# Patient Record
Sex: Female | Born: 1965 | Race: White | Hispanic: No | Marital: Single | Smoking: Former smoker
Health system: Southern US, Community
[De-identification: ages and names within clinical notes are randomized; demographics above are authoritative.]

## PROBLEM LIST (undated history)

## (undated) DIAGNOSIS — C801 Malignant (primary) neoplasm, unspecified: Secondary | ICD-10-CM

## (undated) DIAGNOSIS — K219 Gastro-esophageal reflux disease without esophagitis: Secondary | ICD-10-CM

---

## 2005-05-18 ENCOUNTER — Encounter: Payer: Self-pay | Admitting: Family Medicine

## 2005-05-29 ENCOUNTER — Encounter: Payer: Self-pay | Admitting: Family Medicine

## 2005-08-14 ENCOUNTER — Encounter: Payer: Self-pay | Admitting: Family Medicine

## 2005-10-04 ENCOUNTER — Ambulatory Visit: Payer: Self-pay | Admitting: Internal Medicine

## 2005-10-12 ENCOUNTER — Ambulatory Visit: Payer: Self-pay | Admitting: Internal Medicine

## 2007-02-28 ENCOUNTER — Ambulatory Visit: Payer: Self-pay | Admitting: Internal Medicine

## 2007-02-28 ENCOUNTER — Encounter: Payer: Self-pay | Admitting: Family Medicine

## 2007-03-19 ENCOUNTER — Encounter: Payer: Self-pay | Admitting: Family Medicine

## 2008-11-05 ENCOUNTER — Ambulatory Visit: Payer: Self-pay | Admitting: Family Medicine

## 2008-11-05 DIAGNOSIS — L408 Other psoriasis: Secondary | ICD-10-CM

## 2011-11-30 ENCOUNTER — Ambulatory Visit: Payer: Self-pay

## 2012-01-08 ENCOUNTER — Emergency Department: Payer: Self-pay | Admitting: Unknown Physician Specialty

## 2012-01-08 LAB — URINALYSIS, COMPLETE
Bilirubin,UR: NEGATIVE
Blood: NEGATIVE
Glucose,UR: NEGATIVE mg/dL (ref 0–75)
Nitrite: NEGATIVE
RBC,UR: 1 /HPF (ref 0–5)
Squamous Epithelial: 11

## 2015-09-16 ENCOUNTER — Encounter: Payer: Self-pay | Admitting: *Deleted

## 2015-09-16 ENCOUNTER — Ambulatory Visit
Admission: EM | Admit: 2015-09-16 | Discharge: 2015-09-16 | Disposition: A | Discharge status: Home / Self Care | Payer: Managed Care, Other (non HMO) | Attending: Family Medicine | Admitting: Family Medicine

## 2015-09-16 DIAGNOSIS — R1013 Epigastric pain: Secondary | ICD-10-CM

## 2015-09-16 LAB — URINALYSIS COMPLETE WITH MICROSCOPIC (ARMC ONLY)
BILIRUBIN URINE: NEGATIVE
Bacteria, UA: NONE SEEN
Glucose, UA: NEGATIVE mg/dL
KETONES UR: NEGATIVE mg/dL
LEUKOCYTES UA: NEGATIVE
Nitrite: NEGATIVE
PH: 6.5 (ref 5.0–8.0)
Protein, ur: NEGATIVE mg/dL
SPECIFIC GRAVITY, URINE: 1.02 (ref 1.005–1.030)

## 2015-09-16 LAB — COMPREHENSIVE METABOLIC PANEL
ALBUMIN: 4.5 g/dL (ref 3.5–5.0)
ALT: 28 U/L (ref 14–54)
ANION GAP: 7 (ref 5–15)
AST: 24 U/L (ref 15–41)
Alkaline Phosphatase: 55 U/L (ref 38–126)
BUN: 12 mg/dL (ref 6–20)
CHLORIDE: 107 mmol/L (ref 101–111)
CO2: 25 mmol/L (ref 22–32)
CREATININE: 0.57 mg/dL (ref 0.44–1.00)
Calcium: 9.2 mg/dL (ref 8.9–10.3)
Glucose, Bld: 100 mg/dL — ABNORMAL HIGH (ref 65–99)
POTASSIUM: 4 mmol/L (ref 3.5–5.1)
SODIUM: 139 mmol/L (ref 135–145)
Total Bilirubin: 0.8 mg/dL (ref 0.3–1.2)
Total Protein: 7.5 g/dL (ref 6.5–8.1)

## 2015-09-16 LAB — CBC WITH DIFFERENTIAL/PLATELET
BASOS ABS: 0.1 10*3/uL (ref 0–0.1)
BASOS PCT: 1 %
EOS ABS: 0.2 10*3/uL (ref 0–0.7)
EOS PCT: 3 %
HCT: 41.5 % (ref 35.0–47.0)
HEMOGLOBIN: 14.2 g/dL (ref 12.0–16.0)
LYMPHS ABS: 2 10*3/uL (ref 1.0–3.6)
Lymphocytes Relative: 27 %
MCH: 31.8 pg (ref 26.0–34.0)
MCHC: 34.2 g/dL (ref 32.0–36.0)
MCV: 93.2 fL (ref 80.0–100.0)
Monocytes Absolute: 0.6 10*3/uL (ref 0.2–0.9)
Monocytes Relative: 8 %
NEUTROS PCT: 61 %
Neutro Abs: 4.5 10*3/uL (ref 1.4–6.5)
PLATELETS: 191 10*3/uL (ref 150–440)
RBC: 4.45 MIL/uL (ref 3.80–5.20)
RDW: 12.5 % (ref 11.5–14.5)
WBC: 7.4 10*3/uL (ref 3.6–11.0)

## 2015-09-16 LAB — LIPASE, BLOOD: LIPASE: 26 U/L (ref 11–51)

## 2015-09-16 LAB — AMYLASE: AMYLASE: 84 U/L (ref 28–100)

## 2015-09-16 MED ORDER — SUCRALFATE 1 G PO TABS: 2.0000 g | ORAL_TABLET | Freq: Two times a day (BID) | ORAL | Status: DC

## 2015-09-16 MED ORDER — SUCRALFATE 1 G PO TABS
2.0000 g | ORAL_TABLET | Freq: Two times a day (BID) | ORAL | Status: DC
Start: 2015-09-16 — End: 2015-10-18

## 2015-09-16 NOTE — ED Notes (Signed)
C/o intermittent, intense, epigastric pain for past year. In past week pain has become more continuous and more common at night. Pt states intermittent diarrhea but denies N/V, hematemesis, and black stools.

## 2015-09-16 NOTE — ED Provider Notes (Signed)
CSN: [id number]     Arrival date & time 09/16/15  [id number] History   First MD Initiated Contact with Patient 09/16/15 1121    Nurses notes were reviewed. Chief Complaint  Patient presents with  . Abdominal Pain   Patient is a 50 year old white female with history abdominal pain for over a year. She states she does not have a PCP anymore since she has not seen one in over 3 years. This abdominal pain comes and goes use is somewhat sharp but then it does pass. For the last 3-4 days she's had a upper quadrant abdominal pain that has not gotten better does been persistent and causing a significant mild discomfort. She denies any nausea vomiting or diarrhea. She cannot really time this with eating. However she states that she was unable to sleep last night because of the abdominal pain just got that bad. She did not eat anything today but she has a funeral to go to such did not want ultrasound done today if it was possible. She is requesting a GI consultation but explained this difficult when not sure was going on. She denies any abdominal surgeries. Her father does have or had gallstones. She denies any dyspareunia or vaginal discharge or pelvic pain.       (Consider location/radiation/quality/duration/timing/severity/associated sxs/prior Treatment) Patient is a 50 y.o. female presenting with abdominal pain. The history is provided by the patient. No language interpreter was used.  Abdominal Pain Pain location:  Epigastric Pain quality: aching, cramping and gnawing   Pain quality: not bloating, not burning and no fullness   Pain radiates to:  Does not radiate Pain severity:  Moderate Duration:  3 days Timing:  Constant Chronicity:  Recurrent Context: not alcohol use, not diet changes, not eating, not medication withdrawal, not previous surgeries, not recent illness, not retching, not sick contacts, not suspicious food intake and not trauma   Relieved by:  Nothing Worsened by:  Nothing  tried Ineffective treatments:  None tried Associated symptoms: no chest pain and no shortness of breath   Risk factors: not pregnant     History reviewed. No pertinent past medical history. History reviewed. No pertinent past surgical history. History reviewed. No pertinent family history. Social History  Substance Use Topics  . Smoking status: Former Research scientist (life sciences)  . Smokeless tobacco: None  . Alcohol Use: Yes   OB History    No data available     Review of Systems  Respiratory: Negative for shortness of breath.   Cardiovascular: Negative for chest pain.  Gastrointestinal: Positive for abdominal pain.  All other systems reviewed and are negative.   Allergies  Shellfish allergy  Home Medications   Prior to Admission medications   Medication Sig Start Date End Date Taking? Authorizing Provider  sucralfate (CARAFATE) 1 g tablet Take 2 tablets (2 g total) by mouth 2 (two) times daily. An hour before eating. 09/16/15   Frederich Cha, MD   Meds Ordered and Administered this Visit  Medications - No data to display  BP 131/76 mmHg  Pulse 60  Temp(Src) 98.4 F (36.9 C) (Oral)  Resp 16  Ht 5\' 4"  (1.626 m)  Wt 160 lb (72.576 kg)  BMI 27.45 kg/m2  SpO2 99% No data found.   Physical Exam  Constitutional: She is oriented to person, place, and time. She appears well-developed and well-nourished.  HENT:  Head: Normocephalic and atraumatic.  Eyes: Pupils are equal, round, and reactive to light.  Neck: Normal range of motion. Neck supple.  Cardiovascular: Normal rate, regular rhythm and normal heart sounds.   Pulmonary/Chest: Effort normal and breath sounds normal.  Abdominal: Soft. Bowel sounds are normal. There is no hepatosplenomegaly. There is tenderness in the right upper quadrant and epigastric area. There is no CVA tenderness. No hernia. Hernia confirmed negative in the ventral area.    Musculoskeletal: Normal range of motion. She exhibits no edema.  Lymphadenopathy:    She  has no cervical adenopathy.  Neurological: She is alert and oriented to person, place, and time.  Skin: Skin is warm and dry.  Psychiatric: She has a normal mood and affect. Her behavior is normal.  Vitals reviewed.   ED Course  Procedures (including critical care time)  Labs Review Labs Reviewed  COMPREHENSIVE METABOLIC PANEL - Abnormal; Notable for the following:    Glucose, Bld 100 (*)    All other components within normal limits  URINALYSIS COMPLETEWITH MICROSCOPIC ([facility] ONLY) - Abnormal; Notable for the following:    Hgb urine dipstick TRACE (*)    Squamous Epithelial / LPF 0-5 (*)    All other components within normal limits  URINE CULTURE  CBC WITH DIFFERENTIAL/PLATELET  LIPASE, BLOOD  AMYLASE  H PYLORI, IGM, IGG, IGA AB    Imaging Review No results found.   Visual Acuity Review  Right Eye Distance:   Left Eye Distance:   Bilateral Distance:    Right Eye Near:   Left Eye Near:    Bilateral Near:         MDM   1. Abdominal pain, epigastric    Patient with abdominal pain. Will obtain ultrasound of gallbladder she requested for tomorrow stiff today will acquiesce to her request we'll place on Carafate 2 tablets twice a day on into stomach. Lab work obtained CMP and CBC H. pylori amylase and lipase. But since patient would have to stay away for lab works will have nurses call results but the patient is a come in. Explained to patient that all the lab work is not back today either.  He requested a GI consultation but explained to her will need to get to see the lab results look like ultrasound and if gallbladder has stones she needs a surgical referral and not a GI referral. She states she does not have a primary care the wound was listed because she has not seen them in over 3 years.   Offered work note for today.    Note: This dictation was prepared with Dragon dictation along with smaller phrase technology. Any transcriptional errors that result from  this process are unintentional.    Frederich Cha, MD 09/16/15 1240

## 2015-09-16 NOTE — Discharge Instructions (Signed)

## 2015-09-16 NOTE — ED Notes (Signed)
Ultrasound scheduled for 09/17/15 at 8am at [facility].  Patient instructed nothing to eat or drink after midnight on 09/17/15.

## 2015-09-17 ENCOUNTER — Telehealth: Payer: Self-pay

## 2015-09-17 ENCOUNTER — Ambulatory Visit
Admission: RE | Admit: 2015-09-17 | Discharge: 2015-09-17 | Disposition: A | Discharge status: Home / Self Care | Payer: Managed Care, Other (non HMO) | Source: Ambulatory Visit | Attending: Family Medicine | Admitting: Family Medicine

## 2015-09-17 DIAGNOSIS — R1013 Epigastric pain: Secondary | ICD-10-CM | POA: Diagnosis not present

## 2015-09-17 DIAGNOSIS — R935 Abnormal findings on diagnostic imaging of other abdominal regions, including retroperitoneum: Secondary | ICD-10-CM | POA: Diagnosis not present

## 2015-09-17 DIAGNOSIS — R1011 Right upper quadrant pain: Secondary | ICD-10-CM | POA: Diagnosis present

## 2015-09-17 LAB — URINE CULTURE: Special Requests: NORMAL

## 2015-09-17 LAB — H PYLORI, IGM, IGG, IGA AB

## 2015-09-17 NOTE — ED Notes (Signed)
Received call report today for patient's ultrasound. Patient has 2.7cm Gallstone, gallbladder sludge and 4.65mm wall thickening, and gallbladder polyps. Spoke with Max Sane, FNP she advised that patient would need to see a General Surgeon. Spoke with patient. Patient advised of results and verbalized understanding. Patient given information to contact [place]. Patient verbalized that she will call [facility] to schedule consult.

## 2015-09-20 ENCOUNTER — Telehealth: Payer: Self-pay | Admitting: General Practice

## 2015-09-20 NOTE — Telephone Encounter (Signed)
Spoke with patient, offered to make an appointment to followup from hospital visit.  Pt was seen once here, she states over 10 years ago.  She does not want to re-establish at our office, says she told this to ER staff on 09/16/15, she wants to continue on the way she is doing now.

## 2015-09-30 ENCOUNTER — Encounter: Payer: Self-pay | Admitting: Surgery

## 2015-09-30 ENCOUNTER — Ambulatory Visit (INDEPENDENT_AMBULATORY_CARE_PROVIDER_SITE_OTHER): Payer: 59 | Admitting: Surgery

## 2015-09-30 VITALS — BP 135/82 | HR 55 | Temp 98.2°F | Ht 65.0 in | Wt 164.0 lb

## 2015-09-30 DIAGNOSIS — K811 Chronic cholecystitis: Secondary | ICD-10-CM

## 2015-09-30 NOTE — Patient Instructions (Signed)
Please call our office with any questions or concerns. Please see Blue sheet for surgery information.

## 2015-09-30 NOTE — Progress Notes (Signed)
Patient ID: Dawn Parsons, female   DOB: 12-Dec-1965, 50 y.o.   MRN: [id number]  History of Present Illness Dawn Parsons is a 50 y.o. female with recurrent intermittent epigastric abdominal pain that has been present for the last year or so. She reports that the pain usually is exacerbated after fatty meal located in the epigastric area and dull in nature. Pain is severe, nonradiating it, associated with nausea and emesis. No evidence of biliary obstruction. She recently had an attack that lasted for over 12 hours and now has been having intermittent pains every other day. She has I workup revealing a large gallstone, polyp and thickening of the wall of the gallbladder. Normal common bile duct normal LFTs. She has never had an operation before and she is able to perform more than 4 Mets of activity without any shortness of breath or chest pain  Past Medical History History reviewed. No pertinent past medical history.    History reviewed. No pertinent past surgical history.  Allergies  Allergen Reactions  . Shellfish Allergy Other (See Comments)    unknown    Current Outpatient Prescriptions  Medication Sig Dispense Refill  . sucralfate (CARAFATE) 1 g tablet Take 2 tablets (2 g total) by mouth 2 (two) times daily. An hour before eating. (Patient not taking: Reported on 09/30/2015) 120 tablet 0   No current facility-administered medications for this visit.    Family History Family History  Problem Relation Age of Onset  . Diabetes Father      Social History Social History  Substance Use Topics  . Smoking status: Former Research scientist (life sciences)  . Smokeless tobacco: None  . Alcohol Use: Yes       ROS 10 pt ROS performed and is negative  Physical Exam Blood pressure 135/82, pulse 55, temperature 98.2 F (36.8 C), temperature source Oral, height 5\' 5"  (1.651 m), weight 74.39 kg (164 lb).  CONSTITUTIONAL: NAD alert EYES: Pupils equal, round, and reactive to light, Sclera non-icteric. EARS, NOSE,  MOUTH AND THROAT: The oropharynx is clear. Oral mucosa is pink and moist. Hearing is intact to voice.  NECK: Trachea is midline, and there is no jugular venous distension. Thyroid is without palpable abnormalities. LYMPH NODES:  Lymph nodes in the neck are not enlarged. RESPIRATORY:  Lungs are clear, and breath sounds are equal bilaterally. Normal respiratory effort without pathologic use of accessory muscles. CARDIOVASCULAR: Heart is regular without murmurs, gallops, or rubs. GI: The abdomen is  soft, nontender, and nondistended. There were no palpable masses. There was no hepatosplenomegaly. There were normal bowel sounds. MUSCULOSKELETAL:  Normal muscle strength and tone in all four extremities.    SKIN: Skin turgor is normal. There are no pathologic skin lesions.  NEUROLOGIC:  Motor and sensation is grossly normal.  Cranial nerves are grossly intact. PSYCH:  Alert and oriented to person, place and time. Affect is normal.  Data Reviewed I have personally reviewed the patient's imaging and medical records.    Assessment/  Plan Chronic cholecystitis. Discussed with the patient in detail about the need for cholecystectomy. The risks, benefits, complications, treatment options, and expected outcomes were discussed with the patient. The possibilities of bleeding, recurrent infection, finding a normal gallbladder, perforation of viscus organs, damage to surrounding structures, bile leak, abscess formation, needing a drain placed, the need for additional procedures, reaction to medication, pulmonary aspiration,  failure to diagnose a condition, the possible need to convert to an open procedure, and creating a complication requiring transfusion or operation were  discussed with the patient. The patient and/or family concurred with the proposed plan, giving informed consent.  She wishes to schedule the procedure within the next 3 weeks or so. Extensive counseling provided  Caroleen Hamman, MD [place] 09/30/2015, 10:54 AM

## 2015-10-04 ENCOUNTER — Telehealth: Payer: Self-pay | Admitting: Surgery

## 2015-10-04 NOTE — Telephone Encounter (Signed)
Pt advised of pre op date/time and sx date. Sx: 10/28/15 with Dr Pabon-laparoscopic cholecystectomy.  Pre op: 10/18/15 between 9-1:00pm--phone.   Patient made aware to call [phone], between 1-3:00pm the day before surgery, to find out what time to arrive.    Patient advised of the estimated out of pocket amount for physician is 786.28. Deductible remaining is 2,937.50 with a 20% co insurance.

## 2015-10-18 ENCOUNTER — Telehealth: Payer: Self-pay

## 2015-10-18 ENCOUNTER — Encounter
Admission: RE | Admit: 2015-10-18 | Discharge: 2015-10-18 | Disposition: A | Discharge status: Home / Self Care | Payer: Managed Care, Other (non HMO) | Source: Ambulatory Visit | Attending: Surgery | Admitting: Surgery

## 2015-10-18 HISTORY — DX: Malignant (primary) neoplasm, unspecified: C80.1

## 2015-10-18 HISTORY — DX: Gastro-esophageal reflux disease without esophagitis: K21.9

## 2015-10-18 NOTE — Telephone Encounter (Signed)
Notified by Pre-admit that patient has ring in Clitoris and that surgeon can either go over risks of leaving this in during surgery, have the patient agree to take this out for surgery, or refuse to do the surgery.  Spoke with patient whom is not wanting to remove this but would like to talk this over with her boyfriend before making this decision.  Dr. Dahlia Byes was notified at this time. If patient is not agreeable to remove this for surgery, he would like to review risks of leaving this in prior to surgery. Will have patient come in to clinic later this week to discuss this matter if she is not agreeable to having this removed.

## 2015-10-18 NOTE — Patient Instructions (Signed)
  Your procedure is scheduled on: 10-28-15 Report to Same Day Surgery 2nd floor medical mall To find out your arrival time please call [phone] between 1PM - 3PM on 10-27-15  Remember: Instructions that are not followed completely may result in serious medical risk, up to and including death, or upon the discretion of your surgeon and anesthesiologist your surgery may need to be rescheduled.    _x___ 1. Do not eat food or drink liquids after midnight. No gum chewing or hard candies.     __x__ 2. No Alcohol for 24 hours before or after surgery.   __x__3. No Smoking for 24 prior to surgery.   ____  4. Bring all medications with you on the day of surgery if instructed.    __x__ 5. Notify your doctor if there is any change in your medical condition     (cold, fever, infections).     Do not wear jewelry, make-up, hairpins, clips or nail polish.  Do not wear lotions, powders, or perfumes. You may wear deodorant.  Do not shave 48 hours prior to surgery. Men may shave face and neck.  Do not bring valuables to the hospital.    [facility] is not responsible for any belongings or valuables.               Contacts, dentures or bridgework may not be worn into surgery.  Leave your suitcase in the car. After surgery it may be brought to your room.  For patients admitted to the hospital, discharge time is determined by your treatment team.   Patients discharged the day of surgery will not be allowed to drive home.    Please read over the following fact sheets that you were given:   [facility] Preparing for Surgery and or MRSA Information   ____ Take these medicines the morning of surgery with A SIP OF WATER:    1. NONE  2.  3.  4.  5.  6.  ____ Fleet Enema (as directed)   ____ Use CHG Soap or sage wipes as directed on instruction sheet   ____ Use inhalers on the day of surgery and bring to hospital day of surgery  ____ Stop metformin 2 days prior to surgery    ____ Take 1/2 of  usual insulin dose the night before surgery and none on the morning of surgery.   ____ Stop aspirin or coumadin, or plavix  _x__ Stop Anti-inflammatories such as Advil, Aleve, Ibuprofen, Motrin, Naproxen,          Naprosyn, Goodies powders or aspirin products 7 DAYS PRIOR-Ok to take Tylenol.   ____ Stop supplements until after surgery.    ____ Bring C-Pap to the hospital.

## 2015-10-28 ENCOUNTER — Ambulatory Visit
Admission: RE | Admit: 2015-10-28 | Discharge: 2015-10-28 | Disposition: A | Discharge status: Home / Self Care | Payer: Managed Care, Other (non HMO) | Source: Ambulatory Visit | Attending: Surgery | Admitting: Surgery

## 2015-10-28 ENCOUNTER — Encounter: Payer: Self-pay | Admitting: *Deleted

## 2015-10-28 ENCOUNTER — Ambulatory Visit: Payer: Managed Care, Other (non HMO) | Admitting: Anesthesiology

## 2015-10-28 ENCOUNTER — Encounter
Admission: RE | Disposition: A | Discharge status: Home / Self Care | Payer: Self-pay | Source: Ambulatory Visit | Attending: Surgery

## 2015-10-28 DIAGNOSIS — Z87891 Personal history of nicotine dependence: Secondary | ICD-10-CM | POA: Insufficient documentation

## 2015-10-28 DIAGNOSIS — Z833 Family history of diabetes mellitus: Secondary | ICD-10-CM | POA: Insufficient documentation

## 2015-10-28 DIAGNOSIS — Z79899 Other long term (current) drug therapy: Secondary | ICD-10-CM | POA: Diagnosis not present

## 2015-10-28 DIAGNOSIS — Z91013 Allergy to seafood: Secondary | ICD-10-CM | POA: Diagnosis not present

## 2015-10-28 DIAGNOSIS — K8010 Calculus of gallbladder with chronic cholecystitis without obstruction: Secondary | ICD-10-CM | POA: Insufficient documentation

## 2015-10-28 DIAGNOSIS — K828 Other specified diseases of gallbladder: Secondary | ICD-10-CM | POA: Diagnosis not present

## 2015-10-28 DIAGNOSIS — E669 Obesity, unspecified: Secondary | ICD-10-CM | POA: Insufficient documentation

## 2015-10-28 DIAGNOSIS — K219 Gastro-esophageal reflux disease without esophagitis: Secondary | ICD-10-CM | POA: Diagnosis not present

## 2015-10-28 DIAGNOSIS — Z6827 Body mass index (BMI) 27.0-27.9, adult: Secondary | ICD-10-CM | POA: Insufficient documentation

## 2015-10-28 HISTORY — PX: CHOLECYSTECTOMY: SHX55

## 2015-10-28 SURGERY — LAPAROSCOPIC CHOLECYSTECTOMY
Anesthesia: General | Wound class: Clean Contaminated

## 2015-10-28 MED ORDER — SUCCINYLCHOLINE CHLORIDE 20 MG/ML IJ SOLN
INTRAMUSCULAR | Status: DC | PRN
  Administered 2015-10-28: 100 mg via INTRAVENOUS

## 2015-10-28 MED ORDER — OXYCODONE HCL 5 MG/5ML PO SOLN: 5.0000 mg | Freq: Once | ORAL | Status: DC | PRN

## 2015-10-28 MED ORDER — FENTANYL CITRATE (PF) 100 MCG/2ML IJ SOLN
INTRAMUSCULAR | Status: DC
Start: 2015-10-28 — End: 2015-10-28
  Filled 2015-10-28: qty 2

## 2015-10-28 MED ORDER — MEPERIDINE HCL 25 MG/ML IJ SOLN: 6.2500 mg | INTRAMUSCULAR | Status: DC | PRN

## 2015-10-28 MED ORDER — OXYCODONE-ACETAMINOPHEN 7.5-325 MG PO TABS
ORAL_TABLET | ORAL | Status: AC
  Filled 2015-10-28: qty 2

## 2015-10-28 MED ORDER — OXYCODONE-ACETAMINOPHEN 7.5-325 MG PO TABS
2.0000 | ORAL_TABLET | ORAL | Status: DC | PRN
  Administered 2015-10-28: 2 via ORAL

## 2015-10-28 MED ORDER — CEFAZOLIN SODIUM-DEXTROSE 2-4 GM/100ML-% IV SOLN
INTRAVENOUS | Status: AC
  Administered 2015-10-28: 2 g via INTRAVENOUS
  Filled 2015-10-28: qty 100

## 2015-10-28 MED ORDER — BUPIVACAINE-EPINEPHRINE 0.25% -1:200000 IJ SOLN
INTRAMUSCULAR | Status: DC | PRN
  Administered 2015-10-28: 30 mL

## 2015-10-28 MED ORDER — ROCURONIUM BROMIDE 100 MG/10ML IV SOLN
INTRAVENOUS | Status: DC | PRN
Start: 2015-10-28 — End: 2015-10-28
  Administered 2015-10-28: 10 mg via INTRAVENOUS
  Administered 2015-10-28: 40 mg via INTRAVENOUS

## 2015-10-28 MED ORDER — ACETAMINOPHEN 10 MG/ML IV SOLN
INTRAVENOUS | Status: DC | PRN
  Administered 2015-10-28: 1000 mg via INTRAVENOUS

## 2015-10-28 MED ORDER — FAMOTIDINE 20 MG PO TABS
ORAL_TABLET | ORAL | Status: AC
  Administered 2015-10-28: 20 mg via ORAL
  Filled 2015-10-28: qty 1

## 2015-10-28 MED ORDER — LACTATED RINGERS IV SOLN
INTRAVENOUS | Status: DC
  Administered 2015-10-28: 07:00:00 via INTRAVENOUS

## 2015-10-28 MED ORDER — FENTANYL CITRATE (PF) 100 MCG/2ML IJ SOLN
INTRAMUSCULAR | Status: DC | PRN
  Administered 2015-10-28 (×3): 50 ug via INTRAVENOUS
  Administered 2015-10-28: 100 ug via INTRAVENOUS

## 2015-10-28 MED ORDER — FAMOTIDINE 20 MG PO TABS
20.0000 mg | ORAL_TABLET | Freq: Once | ORAL | Status: AC
  Administered 2015-10-28: 20 mg via ORAL

## 2015-10-28 MED ORDER — CEFAZOLIN SODIUM-DEXTROSE 2-4 GM/100ML-% IV SOLN
2.0000 g | INTRAVENOUS | Status: AC
  Administered 2015-10-28: 2 g via INTRAVENOUS

## 2015-10-28 MED ORDER — CHLORHEXIDINE GLUCONATE CLOTH 2 % EX PADS
6.0000 | MEDICATED_PAD | Freq: Once | CUTANEOUS | Status: DC
Start: 2015-10-28 — End: 2015-10-28

## 2015-10-28 MED ORDER — PROMETHAZINE HCL 25 MG/ML IJ SOLN: 6.2500 mg | INTRAMUSCULAR | Status: DC | PRN

## 2015-10-28 MED ORDER — ACETAMINOPHEN 10 MG/ML IV SOLN
INTRAVENOUS | Status: AC
Start: 2015-10-28 — End: 2015-10-28
  Filled 2015-10-28: qty 100

## 2015-10-28 MED ORDER — MIDAZOLAM HCL 2 MG/2ML IJ SOLN
INTRAMUSCULAR | Status: DC | PRN
  Administered 2015-10-28: 2 mg via INTRAVENOUS

## 2015-10-28 MED ORDER — SUGAMMADEX SODIUM 200 MG/2ML IV SOLN
INTRAVENOUS | Status: DC | PRN
  Administered 2015-10-28: 148.8 mg via INTRAVENOUS

## 2015-10-28 MED ORDER — LIDOCAINE HCL (CARDIAC) 20 MG/ML IV SOLN
INTRAVENOUS | Status: DC | PRN
  Administered 2015-10-28: 100 mg via INTRAVENOUS

## 2015-10-28 MED ORDER — OXYCODONE-ACETAMINOPHEN 7.5-325 MG PO TABS: 2.0000 | ORAL_TABLET | ORAL | 0 refills | Status: DC | PRN

## 2015-10-28 MED ORDER — CHLORHEXIDINE GLUCONATE CLOTH 2 % EX PADS: 6.0000 | MEDICATED_PAD | Freq: Once | CUTANEOUS | Status: DC

## 2015-10-28 MED ORDER — FENTANYL CITRATE (PF) 100 MCG/2ML IJ SOLN
25.0000 ug | INTRAMUSCULAR | Status: DC | PRN
  Administered 2015-10-28 (×4): 25 ug via INTRAVENOUS

## 2015-10-28 MED ORDER — ONDANSETRON HCL 4 MG/2ML IJ SOLN
INTRAMUSCULAR | Status: DC | PRN
  Administered 2015-10-28 (×2): 4 mg via INTRAVENOUS

## 2015-10-28 MED ORDER — BUPIVACAINE-EPINEPHRINE (PF) 0.25% -1:200000 IJ SOLN
INTRAMUSCULAR | Status: AC
  Filled 2015-10-28: qty 30

## 2015-10-28 MED ORDER — DEXAMETHASONE SODIUM PHOSPHATE 10 MG/ML IJ SOLN
INTRAMUSCULAR | Status: DC | PRN
  Administered 2015-10-28: 10 mg via INTRAVENOUS

## 2015-10-28 MED ORDER — PHENYLEPHRINE HCL 10 MG/ML IJ SOLN
INTRAMUSCULAR | Status: DC | PRN
  Administered 2015-10-28: 100 ug via INTRAVENOUS

## 2015-10-28 MED ORDER — OXYCODONE HCL 5 MG PO TABS: 5.0000 mg | ORAL_TABLET | Freq: Once | ORAL | Status: DC | PRN

## 2015-10-28 MED ORDER — PROPOFOL 10 MG/ML IV BOLUS
INTRAVENOUS | Status: DC | PRN
Start: 2015-10-28 — End: 2015-10-28
  Administered 2015-10-28: 160 mg via INTRAVENOUS

## 2015-10-28 SURGICAL SUPPLY — 46 items
APPLICATOR COTTON TIP 6IN STRL (MISCELLANEOUS) ×3 IMPLANT
APPLIER CLIP 5 13 M/L LIGAMAX5 (MISCELLANEOUS) ×3
BLADE SURG 15 STRL LF DISP TIS (BLADE) ×1 IMPLANT
BLADE SURG 15 STRL SS (BLADE) ×2
CANISTER SUCT 1200ML W/VALVE (MISCELLANEOUS) ×3 IMPLANT
CHLORAPREP W/TINT 26ML (MISCELLANEOUS) ×3 IMPLANT
CHOLANGIOGRAM CATH TAUT (CATHETERS) IMPLANT
CLEANER CAUTERY TIP 5X5 PAD (MISCELLANEOUS) ×1 IMPLANT
CLIP APPLIE 5 13 M/L LIGAMAX5 (MISCELLANEOUS) ×1 IMPLANT
DECANTER SPIKE VIAL GLASS SM (MISCELLANEOUS) IMPLANT
DEVICE TROCAR PUNCTURE CLOSURE (ENDOMECHANICALS) IMPLANT
DRAPE C-ARM XRAY 36X54 (DRAPES) IMPLANT
DRESSING SURGICEL FIBRLLR 1X2 (HEMOSTASIS) IMPLANT
DRSG SURGICEL FIBRILLAR 1X2 (HEMOSTASIS)
ELECT REM PT RETURN 9FT ADLT (ELECTROSURGICAL) ×3
ELECTRODE REM PT RTRN 9FT ADLT (ELECTROSURGICAL) ×1 IMPLANT
ENDOPOUCH RETRIEVER 10 (MISCELLANEOUS) ×3 IMPLANT
GLOVE BIO SURGEON STRL SZ7 (GLOVE) ×9 IMPLANT
GOWN STRL REUS W/ TWL LRG LVL3 (GOWN DISPOSABLE) ×3 IMPLANT
GOWN STRL REUS W/TWL LRG LVL3 (GOWN DISPOSABLE) ×6
HEMOSTAT SURGICEL 2X14 (HEMOSTASIS) ×3 IMPLANT
IRRIGATION STRYKERFLOW (MISCELLANEOUS) ×1 IMPLANT
IRRIGATOR STRYKERFLOW (MISCELLANEOUS) ×3
IV CATH ANGIO 12GX3 LT BLUE (NEEDLE) IMPLANT
IV SOD CHL 0.9% 1000ML (IV SOLUTION) ×3 IMPLANT
L-HOOK LAP DISP 36CM (ELECTROSURGICAL) ×3
LHOOK LAP DISP 36CM (ELECTROSURGICAL) ×1 IMPLANT
LIQUID BAND (GAUZE/BANDAGES/DRESSINGS) ×3 IMPLANT
NEEDLE HYPO 22GX1.5 SAFETY (NEEDLE) ×3 IMPLANT
PACK LAP CHOLECYSTECTOMY (MISCELLANEOUS) ×3 IMPLANT
PAD CLEANER CAUTERY TIP 5X5 (MISCELLANEOUS) ×2
PENCIL ELECTRO HAND CTR (MISCELLANEOUS) ×3 IMPLANT
SCISSORS METZENBAUM CVD 33 (INSTRUMENTS) ×3 IMPLANT
SLEEVE ENDOPATH XCEL 5M (ENDOMECHANICALS) ×6 IMPLANT
SOL ANTI-FOG 6CC FOG-OUT (MISCELLANEOUS) ×1 IMPLANT
SOL FOG-OUT ANTI-FOG 6CC (MISCELLANEOUS) ×2
STOPCOCK 3 WAY  REPLAC (MISCELLANEOUS) IMPLANT
SUT ETHIBOND 0 MO6 C/R (SUTURE) IMPLANT
SUT MNCRL AB 4-0 PS2 18 (SUTURE) ×3 IMPLANT
SUT VIC AB 0 CT2 27 (SUTURE) IMPLANT
SUT VICRYL 0 AB UR-6 (SUTURE) ×6 IMPLANT
SYR 20CC LL (SYRINGE) ×3 IMPLANT
TROCAR XCEL BLUNT TIP 100MML (ENDOMECHANICALS) ×3 IMPLANT
TROCAR XCEL NON-BLD 5MMX100MML (ENDOMECHANICALS) ×3 IMPLANT
TUBING INSUFFLATOR HI FLOW (MISCELLANEOUS) ×3 IMPLANT
WATER STERILE IRR 1000ML POUR (IV SOLUTION) ×3 IMPLANT

## 2015-10-28 NOTE — Anesthesia Procedure Notes (Signed)
Procedure Name: Intubation Date/Time: 10/28/2015 7:43 AM Performed by: Nelda Marseille Pre-anesthesia Checklist: Patient identified, Patient being monitored, Timeout performed, Emergency Drugs available and Suction available Patient Re-evaluated:Patient Re-evaluated prior to inductionOxygen Delivery Method: Circle system utilized Preoxygenation: Pre-oxygenation with 100% oxygen Intubation Type: IV induction Ventilation: Mask ventilation without difficulty Laryngoscope Size: Mac and 3 Grade View: Grade I Tube type: Oral Tube size: 7.0 mm Number of attempts: 1 Airway Equipment and Method: Stylet Placement Confirmation: ETT inserted through vocal cords under direct vision,  positive ETCO2 and breath sounds checked- equal and bilateral Secured at: 21 cm Tube secured with: Tape Dental Injury: Teeth and Oropharynx as per pre-operative assessment

## 2015-10-28 NOTE — Interval H&P Note (Signed)
History and Physical Interval Note:  10/28/2015 6:54 AM  Dawn Parsons  has presented today for surgery, with the diagnosis of cholecystitis  The various methods of treatment have been discussed with the patient and family. After consideration of risks, benefits and other options for treatment, the patient has consented to  Procedure(s): LAPAROSCOPIC CHOLECYSTECTOMY (N/A) as a surgical intervention .  The patient's history has been reviewed, patient examined, no change in status, stable for surgery.  I have reviewed the patient's chart and labs.  Questions were answered to the patient's satisfaction.   Pt has removed her piercing in her clitoris  [place]

## 2015-10-28 NOTE — H&P (View-Only) (Signed)
Patient ID: Dawn Parsons, female   DOB: 1966/03/20, 50 y.o.   MRN: [id number]  History of Present Illness Dawn Parsons is a 50 y.o. female with recurrent intermittent epigastric abdominal pain that has been present for the last year or so. She reports that the pain usually is exacerbated after fatty meal located in the epigastric area and dull in nature. Pain is severe, nonradiating it, associated with nausea and emesis. No evidence of biliary obstruction. She recently had an attack that lasted for over 12 hours and now has been having intermittent pains every other day. She has I workup revealing a large gallstone, polyp and thickening of the wall of the gallbladder. Normal common bile duct normal LFTs. She has never had an operation before and she is able to perform more than 4 Mets of activity without any shortness of breath or chest pain  Past Medical History History reviewed. No pertinent past medical history.    History reviewed. No pertinent past surgical history.  Allergies  Allergen Reactions  . Shellfish Allergy Other (See Comments)    unknown    Current Outpatient Prescriptions  Medication Sig Dispense Refill  . sucralfate (CARAFATE) 1 g tablet Take 2 tablets (2 g total) by mouth 2 (two) times daily. An hour before eating. (Patient not taking: Reported on 09/30/2015) 120 tablet 0   No current facility-administered medications for this visit.    Family History Family History  Problem Relation Age of Onset  . Diabetes Father      Social History Social History  Substance Use Topics  . Smoking status: Former Research scientist (life sciences)  . Smokeless tobacco: None  . Alcohol Use: Yes       ROS 10 pt ROS performed and is negative  Physical Exam Blood pressure 135/82, pulse 55, temperature 98.2 F (36.8 C), temperature source Oral, height 5\' 5"  (1.651 m), weight 74.39 kg (164 lb).  CONSTITUTIONAL: NAD alert EYES: Pupils equal, round, and reactive to light, Sclera non-icteric. EARS, NOSE,  MOUTH AND THROAT: The oropharynx is clear. Oral mucosa is pink and moist. Hearing is intact to voice.  NECK: Trachea is midline, and there is no jugular venous distension. Thyroid is without palpable abnormalities. LYMPH NODES:  Lymph nodes in the neck are not enlarged. RESPIRATORY:  Lungs are clear, and breath sounds are equal bilaterally. Normal respiratory effort without pathologic use of accessory muscles. CARDIOVASCULAR: Heart is regular without murmurs, gallops, or rubs. GI: The abdomen is  soft, nontender, and nondistended. There were no palpable masses. There was no hepatosplenomegaly. There were normal bowel sounds. MUSCULOSKELETAL:  Normal muscle strength and tone in all four extremities.    SKIN: Skin turgor is normal. There are no pathologic skin lesions.  NEUROLOGIC:  Motor and sensation is grossly normal.  Cranial nerves are grossly intact. PSYCH:  Alert and oriented to person, place and time. Affect is normal.  Data Reviewed I have personally reviewed the patient's imaging and medical records.    Assessment/  Plan Chronic cholecystitis. Discussed with the patient in detail about the need for cholecystectomy. The risks, benefits, complications, treatment options, and expected outcomes were discussed with the patient. The possibilities of bleeding, recurrent infection, finding a normal gallbladder, perforation of viscus organs, damage to surrounding structures, bile leak, abscess formation, needing a drain placed, the need for additional procedures, reaction to medication, pulmonary aspiration,  failure to diagnose a condition, the possible need to convert to an open procedure, and creating a complication requiring transfusion or operation were  discussed with the patient. The patient and/or family concurred with the proposed plan, giving informed consent.  She wishes to schedule the procedure within the next 3 weeks or so. Extensive counseling provided  Caroleen Hamman, MD [place] 09/30/2015, 10:54 AM

## 2015-10-28 NOTE — Anesthesia Postprocedure Evaluation (Signed)
Anesthesia Post Note  Patient: Dawn Parsons  Procedure(s) Performed: Procedure(s) (LRB): LAPAROSCOPIC CHOLECYSTECTOMY (N/A)  Patient location during evaluation: PACU Anesthesia Type: General Level of consciousness: awake and alert and oriented Pain management: pain level controlled Vital Signs Assessment: post-procedure vital signs reviewed and stable Respiratory status: spontaneous breathing, nonlabored ventilation and respiratory function stable Cardiovascular status: blood pressure returned to baseline and stable Postop Assessment: no signs of nausea or vomiting Anesthetic complications: no    Last Vitals:  Vitals:   10/28/15 0959 10/28/15 1014  BP: 121/61   Pulse: 78   Resp: 17   Temp:  36.7 C    Last Pain:  Vitals:   10/28/15 1055  TempSrc:   PainSc: 8                  Taler Kushner

## 2015-10-28 NOTE — Anesthesia Preprocedure Evaluation (Signed)
Anesthesia Evaluation  Patient identified by MRN, date of birth, ID band Patient awake    Reviewed: Allergy & Precautions, NPO status , Patient's Chart, lab work & pertinent test results  History of Anesthesia Complications Negative for: history of anesthetic complications  Airway Mallampati: II  TM Distance: >3 FB Neck ROM: Full    Dental no notable dental hx.    Pulmonary neg sleep apnea, neg COPD, former smoker,    breath sounds clear to auscultation- rhonchi (-) wheezing      Cardiovascular Exercise Tolerance: Good (-) hypertension(-) CAD and (-) Past MI  Rhythm:Regular Rate:Normal - Systolic murmurs and - Diastolic murmurs    Neuro/Psych negative neurological ROS  negative psych ROS   GI/Hepatic Neg liver ROS, GERD  ,  Endo/Other  negative endocrine ROSneg diabetes  Renal/GU negative Renal ROS     Musculoskeletal negative musculoskeletal ROS (+)   Abdominal (+) - obese,   Peds  Hematology negative hematology ROS (+)   Anesthesia Other Findings   Reproductive/Obstetrics                             Anesthesia Physical Anesthesia Plan  ASA: I  Anesthesia Plan: General   Post-op Pain Management:    Induction: Intravenous  Airway Management Planned: Oral ETT  Additional Equipment:   Intra-op Plan:   Post-operative Plan: Extubation in OR  Informed Consent: I have reviewed the patients History and Physical, chart, labs and discussed the procedure including the risks, benefits and alternatives for the proposed anesthesia with the patient or authorized representative who has indicated his/her understanding and acceptance.     Plan Discussed with: CRNA and Anesthesiologist  Anesthesia Plan Comments:         Anesthesia Quick Evaluation

## 2015-10-28 NOTE — Discharge Instructions (Addendum)
AMBULATORY SURGERY  DISCHARGE INSTRUCTIONS   1) The drugs that you were given will stay in your system until tomorrow so for the next 24 hours you should not:  A) Drive an automobile B) Make any legal decisions C) Drink any alcoholic beverage   2) You may resume regular meals tomorrow.  Today it is better to start with liquids and gradually work up to solid foods.  You may eat anything you prefer, but it is better to start with liquids, then soup and crackers, and gradually work up to solid foods.   3) Please notify your doctor immediately if you have any unusual bleeding, trouble breathing, redness and pain at the surgery site, drainage, fever, or pain not relieved by medication.    4) Additional Instructions:        Please contact your physician with any problems or Same Day Surgery at [phone], Monday through Friday 6 am to 4 pm, or [facility] Main number at [phone].Laparoscopic Cholecystectomy, Care After   These instructions give you information on caring for yourself after your procedure. Your doctor may also give you more specific instructions. Call your doctor if you have any problems or questions after your procedure.  HOME CARE  Change your bandages (dressings) as told by your doctor.  Keep the wound dry and clean. Wash the wound gently with soap and water. Pat the wound dry with a clean towel.  Do not take baths, swim, or use hot tubs for 2 weeks, or as told by your doctor.  Only take medicine as told by your doctor.  Eat a normal diet as told by your doctor.  Do not lift anything heavier than 10 pounds (4.5 kg) until your doctor says it is okay.  Do not play contact sports for 1 week, or as told by your doctor. GET HELP IF:  Your wound is red, puffy (swollen), or painful.  You have yellowish-white fluid (pus) coming from the wound.  You have fluid draining from the wound for more than 1 day.  You have a bad smell coming from the wound.   Your wound breaks open. GET HELP RIGHT AWAY IF:  You have trouble breathing.  You have chest pain.  You have a fever >101  You have pain in the shoulders (shoulder strap areas) that is getting worse.  You feel dizzy or pass out (faint).  You have severe belly (abdominal) pain.  You feel sick to your stomach (nauseous) or throw up (vomit) for more than 1 day.   

## 2015-10-28 NOTE — Transfer of Care (Signed)
Immediate Anesthesia Transfer of Care Note  Patient: Dawn Parsons  Procedure(s) Performed: Procedure(s): LAPAROSCOPIC CHOLECYSTECTOMY (N/A)  Patient Location: PACU  Anesthesia Type:General  Level of Consciousness: awake and sedated  Airway & Oxygen Therapy: Patient Spontanous Breathing and Patient connected to face mask oxygen  Post-op Assessment: Report given to RN and Post -op Vital signs reviewed and stable  Post vital signs: Reviewed and stable  Last Vitals:  Vitals:   10/28/15 0613  BP: 110/74  Pulse: 64  Resp: 16  Temp: 36.6 C    Last Pain:  Vitals:   10/28/15 0613  TempSrc: Oral         Complications: No apparent anesthesia complications

## 2015-10-28 NOTE — Op Note (Signed)
Laparoscopic Cholecystectomy  Pre-operative Diagnosis: Chronic Cholecystitis  Post-operative Diagnosis: Same  Procedure: 1. Laparoscopic lysis of adhesions taking at least 45 minutes of total operative time, this was a major portion of the procedure 2. laparoscopic cholecystectomy  Surgeon: Caroleen Hamman, MD FACS  Anesthesia: Gen. with endotracheal tube   Findings: Chronic Cholecystitis  Thick and extensive adhesions from the omentum to the liver from the omentum to the gallbladder and some portion of the duodenum to the gallbladder. Large gallstone stuck in the gallbladder neck  Estimated Blood Loss: 50           Specimens: Gallbladder           Complications: none   Procedure Details  The patient was seen again in the Holding Room. The benefits, complications, treatment options, and expected outcomes were discussed with the patient. The risks of bleeding, infection, recurrence of symptoms, failure to resolve symptoms, bile duct damage, bile duct leak, retained common bile duct stone, bowel injury, any of which could require further surgery and/or ERCP, stent, or papillotomy were reviewed with the patient. The likelihood of improving the patient's symptoms with return to their baseline status is good.  The patient and/or family concurred with the proposed plan, giving informed consent.  The patient was taken to Operating Room, identified as Dawn Parsons and the procedure verified as Laparoscopic Cholecystectomy.  A Time Out was held and the above information confirmed.  Prior to the induction of general anesthesia, antibiotic prophylaxis was administered. VTE prophylaxis was in place. General endotracheal anesthesia was then administered and tolerated well. After the induction, the abdomen was prepped with Chloraprep and draped in the sterile fashion. The patient was positioned in the supine position.  Local anesthetic  was injected into the skin near the umbilicus and an incision  made. Cut down technique was used to enter the abdominal cavity and a Hasson trochar was placed after two vicryl stitches were anchored to the fascia. Pneumoperitoneum was then created with CO2 and tolerated well without any adverse changes in the patient's vital signs.  Three 5-mm ports were placed in the right upper quadrant all under direct vision. All skin incisions  were infiltrated with a local anesthetic agent before making the incision and placing the trocars.   The patient was positioned  in reverse Trendelenburg, tilted slightly to the patient's left.  There was significant adhesions from the gallbladder to the omentum and also from the omentum to the liver bed in the lateral portion and also in the central portion. These were very carefully taken down with a combination of electrocautery and scissors. Also there was a portion of the duodenum that was I adhered to the gallbladder this were thin adhesions and they were taken down sharply. The gallbladder was significantly inflamed, with chronic inflammatory changes.   The gallbladder was identified, the fundus grasped and retracted cephalad. Adhesions were lysed bluntly. The infundibulum was grasped and retracted laterally, exposing the peritoneum overlying the triangle of Calot. Due to the significant inflammatory response I decided to do a dome down technique to better expose the cystic artery and the cystic duct and have a true expanded critical window.  An extended critical view of the cystic duct and cystic artery was obtained.  The cystic duct was clearly identified and bluntly dissected.   Artery and duct were double clipped and divided. The gallbladder was removed and placed in an Endocatch bag. The liver bed was irrigated and inspected. Hemostasis was achieved with the electrocautery.  I placed a small piece of Surgicel because there was some raw surface on the edge of the liver secondary to chronic inflammation Copious irrigation was  utilized and was repeatedly aspirated until clear.  The gallbladder and Endocatch sac were then removed through the epigastric port site.   Inspection of the right upper quadrant was performed. No bleeding, bile duct injury or leak, or bowel injury was noted. Pneumoperitoneum was released.  The periumbilical port site was closed with figure-of-eight 0 Vicryl sutures. 4-0 subcuticular Monocryl was used to close the skin. Dermabond was  applied.  The patient was then extubated and brought to the recovery room in stable condition. Sponge, lap, and needle counts were correct at closure and at the conclusion of the case.               Caroleen Hamman, MD, FACS

## 2015-10-29 LAB — SURGICAL PATHOLOGY

## 2015-11-02 ENCOUNTER — Other Ambulatory Visit: Payer: Self-pay | Admitting: Surgery

## 2015-11-02 NOTE — Telephone Encounter (Signed)
Returned phone call to patient at this time. No answer. Unable to leave a message as mailbox is currently full. Will try once again at a later time.

## 2015-11-02 NOTE — Telephone Encounter (Signed)
Patient had LAPAROSCOPIC CHOLECYSTECTOMY with Dr Dahlia Byes on 8/3. She only has 3 pain pills left and her follow up appointment isn't until Friday August 11th. She would like a refill. Please call and advise.

## 2015-11-03 NOTE — Telephone Encounter (Signed)
Called patient once again at this time. No answer. Unable to leave voicemail as this is still full.  Will be glad to speak with patient if she returns phone call.  Patient has follow-up appointment scheduled for 8/11.

## 2015-11-04 ENCOUNTER — Ambulatory Visit (INDEPENDENT_AMBULATORY_CARE_PROVIDER_SITE_OTHER): Payer: 59 | Admitting: Surgery

## 2015-11-04 ENCOUNTER — Encounter: Payer: Self-pay | Admitting: Surgery

## 2015-11-04 VITALS — BP 130/83 | HR 67 | Temp 97.7°F | Ht 65.0 in | Wt 159.0 lb

## 2015-11-04 DIAGNOSIS — Z09 Encounter for follow-up examination after completed treatment for conditions other than malignant neoplasm: Secondary | ICD-10-CM

## 2015-11-04 NOTE — Patient Instructions (Signed)
Please call if you have questions or concerns.  

## 2015-11-04 NOTE — Progress Notes (Signed)
S/p lap chole 8/3 Doing well Had some post op pain but has significantly improved and is not requiring narcotics at this time Path d/w pt in detail Taking PO, + BM  PE NAD Abd: soft, NT, some ecchymosis from local injection. Incisions are healing well without evidence of infection. No evidence of peritonitis  A/p Doing well No heavy lifting F/U prn No need for narcotics at this time advice about NSAIDS and tylenol as well as ice packs

## 2016-09-18 ENCOUNTER — Ambulatory Visit (INDEPENDENT_AMBULATORY_CARE_PROVIDER_SITE_OTHER): Payer: Managed Care, Other (non HMO) | Admitting: Obstetrics and Gynecology

## 2016-09-18 ENCOUNTER — Encounter: Payer: Self-pay | Admitting: Obstetrics and Gynecology

## 2016-09-18 DIAGNOSIS — N750 Cyst of Bartholin's gland: Secondary | ICD-10-CM

## 2016-09-18 NOTE — Progress Notes (Signed)
Obstetrics & Gynecology Office Visit   Chief Complaint  Patient presents with  . Vaginal Pain   History of Present Illness: 51 y.o. G0P0000 female who presents for a two-week history of noting a knot in her right vaginal opening. She noted it after intercourse, which was painful. She inspected the area and found a grape-size area that was only mildly tender to touch. The size has gone down now and the pain has resolved.  She notes no apparent drainage from the area.  She denies fevers, chills, history of STDs, new partner.  She has never had this before.    Past Medical History:  Diagnosis Date  . 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 GERD (gastroesophageal reflux disease)    OCC    Past Surgical History:  Procedure Laterality Date  . CHOLECYSTECTOMY N/A 10/28/2015   Procedure: LAPAROSCOPIC CHOLECYSTECTOMY;  Surgeon: Jules Husbands, MD;  Location: [facility] ORS;  Service: General;  Laterality: N/A;    Gynecologic History: No LMP recorded. Patient is postmenopausal.  Obstetric History: G0P0000  Family History  Problem Relation Age of Onset  . Diabetes Father     Social History   Social History  . Marital status: Married    Spouse name: N/A  . Number of children: N/A  . Years of education: N/A   Occupational History  . Not on file.   Social History Main Topics  . Smoking status: Former Smoker    Packs/day: 0.50    Years: 5.00    Types: Cigarettes    Quit date: 10/17/2005  . Smokeless tobacco: Never Used  . Alcohol use Yes     Comment: OCC  . Drug use: No  . Sexual activity: Yes    Birth control/ protection: None   Other Topics Concern  . Not on file   Social History Narrative  . No narrative on file    Allergies  Allergen Reactions  . Shellfish Allergy Other (See Comments)    Childhood allergy    Prior to Admission medications   Medication Sig Start Date End Date Taking? Authorizing Provider  calcium carbonate (TUMS - DOSED IN MG ELEMENTAL CALCIUM) 500  MG chewable tablet Chew 1 tablet by mouth as needed for indigestion or heartburn.    [provider]    Review of Systems  Constitutional: Negative.   HENT: Negative.   Eyes: Negative.   Respiratory: Negative.   Cardiovascular: Negative.   Gastrointestinal: Negative.   Genitourinary: Negative.   Musculoskeletal: Negative.   Skin: Negative.   Neurological: Negative.   Psychiatric/Behavioral: Negative.      Physical Exam BP 128/82   Ht 5\' 4"  (1.626 m)   Wt 164 lb (74.4 kg)   BMI 28.15 kg/m  No LMP recorded. Patient is postmenopausal. Physical Exam  Constitutional: She is oriented to person, place, and time. She appears well-developed and well-nourished. No distress.  Genitourinary: Vagina normal and uterus normal. Pelvic exam was performed with patient supine.  There is Bartholin's cyst (cyst measures about 1cm in maximal diameter, not tender to touch, no obvious drainage, erythema, or warmth) on the right labia. There is no rash, tenderness, lesion or injury on the right labia. There is no rash, tenderness, lesion, injury or Bartholin's cyst on the left labia. Vagina exhibits no lesion. No bleeding in the vagina. No signs of injury around the vagina. Right adnexum does not display mass, does not display tenderness and does not display fullness. Left adnexum does not display mass,  does not display tenderness and does not display fullness. Cervix does not exhibit motion tenderness, lesion or polyp.   Uterus is mobile and anteverted. Uterus is not enlarged, tender, exhibiting a mass or irregular (is regular).  Eyes: EOM are normal. No scleral icterus.  Neck: Normal range of motion. Neck supple.  Cardiovascular: Normal rate and regular rhythm.   Pulmonary/Chest: Effort normal and breath sounds normal. No respiratory distress. She has no wheezes. She has no rales.  Abdominal: Soft. Bowel sounds are normal. She exhibits no distension and no mass. There is no tenderness. There is no  rebound and no guarding.  Musculoskeletal: Normal range of motion. She exhibits no edema.  Neurological: She is alert and oriented to person, place, and time. No cranial nerve deficit.  Skin: Skin is warm and dry. No erythema.  Psychiatric: She has a normal mood and affect. Her behavior is normal. Judgment normal.   Female chaperone present for pelvic and breast  portions of the physical exam  Assessment: 51 y.o. G0P0000 female with a right Bartholin's Gland Cyst, resolving.    Plan: Problem List Items Addressed This Visit    Bartholin's gland cyst    Educated patient on nature of Bartholin's gland cysts.  Discussed that if hers did not resolve, would need to possibly drain the cyst and possibly place a Word Catheter versus marsupialization.  Rarely, these can be the result of an infection or cancer. Given hers is resolving, will monitor.  Will schedule her for an annual exam soon and repeat examination of her cyst at that time.    20 minutes spent in face to face discussion with > 50% spent in counseling and management of her Bartholin's Gland Cyst.   Prentice Docker, MD 09/21/2016 9:29 AM

## 2016-11-20 ENCOUNTER — Encounter: Payer: Self-pay | Admitting: Obstetrics and Gynecology

## 2016-11-20 ENCOUNTER — Ambulatory Visit (INDEPENDENT_AMBULATORY_CARE_PROVIDER_SITE_OTHER): Payer: Managed Care, Other (non HMO) | Admitting: Obstetrics and Gynecology

## 2016-11-20 VITALS — BP 118/74 | Ht 64.0 in | Wt 163.0 lb

## 2016-11-20 DIAGNOSIS — Z1211 Encounter for screening for malignant neoplasm of colon: Secondary | ICD-10-CM | POA: Diagnosis not present

## 2016-11-20 DIAGNOSIS — Z01419 Encounter for gynecological examination (general) (routine) without abnormal findings: Secondary | ICD-10-CM

## 2016-11-20 DIAGNOSIS — Z1389 Encounter for screening for other disorder: Secondary | ICD-10-CM

## 2016-11-20 DIAGNOSIS — Z124 Encounter for screening for malignant neoplasm of cervix: Secondary | ICD-10-CM | POA: Diagnosis not present

## 2016-11-20 DIAGNOSIS — Z1331 Encounter for screening for depression: Secondary | ICD-10-CM

## 2016-11-20 DIAGNOSIS — Z1339 Encounter for screening examination for other mental health and behavioral disorders: Secondary | ICD-10-CM

## 2016-11-20 NOTE — Progress Notes (Signed)
Routine Annual Gynecology Examination   PCP: Patient, No Pcp Per  Chief Complaint  Patient presents with  . Annual Exam   History of Present Illness: Patient is a 51 y.o. G0P0000 presents for annual exam. The patient has no complaints today.   Menopausal bleeding: denies  Menopausal symptoms: reports, mild occasional.  Much improved from before. No history of HRT.  Breast symptoms: denies  Last pap smear: several years ago.  Result Normal  Last mammogram: several years ago.  Result Normal   Colonoscopy: never had  Past Medical History:  Diagnosis Date  . 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 GERD (gastroesophageal reflux disease)    OCC    Past Surgical History:  Procedure Laterality Date  . CHOLECYSTECTOMY N/A 10/28/2015   Procedure: LAPAROSCOPIC CHOLECYSTECTOMY;  Surgeon: Jules Husbands, MD;  Location: [facility] ORS;  Service: General;  Laterality: N/A;    Prior to Admission medications   Medication Sig Start Date End Date Taking? Authorizing Provider  calcium carbonate (TUMS - DOSED IN MG ELEMENTAL CALCIUM) 500 MG chewable tablet Chew 1 tablet by mouth as needed for indigestion or heartburn.   Yes [provider]    Allergies  Allergen Reactions  . Shellfish Allergy Other (See Comments)    Childhood allergy    Gynecologic History:  No LMP recorded. Patient is postmenopausal. Contraception: none  Obstetric History: G0P0000  Social History   Social History  . Marital status: Married    Spouse name: N/A  . Number of children: N/A  . Years of education: N/A   Occupational History  . Not on file.   Social History Main Topics  . Smoking status: Former Smoker    Packs/day: 0.50    Years: 5.00    Types: Cigarettes    Quit date: 10/17/2005  . Smokeless tobacco: Never Used  . Alcohol use Yes     Comment: OCC  . Drug use: No  . Sexual activity: Yes    Birth control/ protection: Post-menopausal   Other Topics Concern  . Not on file   Social  History Narrative  . No narrative on file    Family History  Problem Relation Age of Onset  . Diabetes Father     Review of Systems  Constitutional: Negative.   HENT: Negative.   Eyes: Negative.   Respiratory: Negative.   Cardiovascular: Negative.   Gastrointestinal: Positive for diarrhea. Negative for abdominal pain, blood in stool, constipation, heartburn, melena, nausea and vomiting.  Genitourinary: Negative.   Musculoskeletal: Negative.   Skin: Negative.   Neurological: Negative.   Psychiatric/Behavioral: Negative.      Physical Exam Vitals: BP 118/74   Ht 5\' 4"  (1.626 m)   Wt 163 lb (73.9 kg)   BMI 27.98 kg/m   Physical Exam  Constitutional: She is oriented to person, place, and time. She appears well-developed and well-nourished. No distress.  Genitourinary: Vagina normal and uterus normal. Pelvic exam was performed with patient supine. There is no rash, tenderness or lesion on the right labia. There is no rash, tenderness or lesion on the left labia. Vagina exhibits no lesion and no rugosity. No erythema or bleeding in the vagina. No signs of injury around the vagina. Right adnexum does not display mass, does not display tenderness and does not display fullness. Left adnexum does not display mass, does not display tenderness and does not display fullness. Cervix does not exhibit motion tenderness, lesion or polyp.   Uterus is mobile and  anteverted. Uterus is not enlarged, tender, exhibiting a mass or irregular (is regular).  HENT:  Head: Normocephalic and atraumatic.  Eyes: EOM are normal. No scleral icterus.  Neck: Normal range of motion. Neck supple. No thyromegaly present.  Cardiovascular: Normal rate and regular rhythm.  Exam reveals no gallop and no friction rub.   No murmur heard. Pulmonary/Chest: Effort normal and breath sounds normal. No respiratory distress. She has no wheezes. She has no rales.  Abdominal: Soft. Bowel sounds are normal. She exhibits no  distension and no mass. There is no tenderness. There is no rebound and no guarding.  Musculoskeletal: Normal range of motion. She exhibits no edema.  Lymphadenopathy:    She has no cervical adenopathy.  Neurological: She is alert and oriented to person, place, and time. No cranial nerve deficit.  Skin: Skin is warm and dry. Rash (located on right arm a patch with scaling, erythema, about 5 x 5 cm, nontender) noted. No erythema.  Psychiatric: She has a normal mood and affect. Her behavior is normal. Judgment normal.    Female chaperone present for pelvic and breast  portions of the physical exam  Results: AUDIT Questionnaire (screen for alcoholism): 6 PHQ-9: 3   Assessment and Plan:  51 y.o. G0P0000 female here for routine annual gynecologic examination  Plan: Problem List Items Addressed This Visit    None    Visit Diagnoses    Women's annual routine gynecological examination    -  Primary   Relevant Orders   IGP, Aptima HPV, rfx 16/18,45   Ambulatory referral to Gastroenterology   Pap smear for cervical cancer screening       Relevant Orders   IGP, Aptima HPV, rfx 16/18,45   Screening for depression       Screening for alcoholism       Screen for colon cancer       Relevant Orders   Ambulatory referral to Gastroenterology      Screening: -- Blood pressure screen normal -- Colonoscopy - due - will schedule -- Mammogram - due. Patient to call [facility] to arrange. She understands that it is her responsibility to arrange this. -- Weight screening: overweight: continue to monitor -- Depression screening negative (PHQ-9) -- Nutrition: normal -- cholesterol screening: not due for screening -- osteoporosis screening: not due -- tobacco screening: not using -- alcohol screening: AUDIT questionnaire indicates low-risk usage. -- family history of breast cancer screening: done. not at high risk. -- no evidence of domestic violence or intimate partner violence. -- STD  screening: gonorrhea/chlamydia NAAT not collected per patient request. -- pap smear collected per ASCCP guidelines -- HPV vaccination series: not eligilbe  Prentice Docker, MD 11/20/2016 6:06 PM

## 2016-11-22 ENCOUNTER — Encounter: Payer: Self-pay | Admitting: Obstetrics and Gynecology

## 2016-11-22 LAB — IGP, APTIMA HPV, RFX 16/18,45
HPV APTIMA: NEGATIVE
PAP SMEAR COMMENT: 0

## 2017-01-23 ENCOUNTER — Encounter: Payer: Self-pay | Admitting: Gastroenterology

## 2017-01-23 ENCOUNTER — Ambulatory Visit (INDEPENDENT_AMBULATORY_CARE_PROVIDER_SITE_OTHER): Payer: Managed Care, Other (non HMO) | Admitting: Gastroenterology

## 2017-01-23 ENCOUNTER — Other Ambulatory Visit: Payer: Self-pay

## 2017-01-23 VITALS — BP 126/78 | HR 80 | Temp 98.3°F | Ht 64.0 in | Wt 156.4 lb

## 2017-01-23 DIAGNOSIS — R197 Diarrhea, unspecified: Secondary | ICD-10-CM

## 2017-01-23 MED ORDER — CHOLESTYRAMINE LIGHT 4 G PO PACK: 4.0000 g | PACK | Freq: Two times a day (BID) | ORAL | 3 refills | Status: DC

## 2017-01-24 NOTE — Progress Notes (Signed)
Gastroenterology Consultation  Referring Provider:     Will Bonnet, MD Primary Care Physician:  Dawn Parsons, No Pcp Per Primary Gastroenterologist:  Dr. Allen Norris     Reason for Consultation:     Intermittent diarrhea        HPI:   Dawn Parsons is a 51 y.o. y/o female referred for consultation & management of intermittent diarrhea by Dr. Patient, No Pcp Per.  This Dawn Parsons comes today with a history of intermittent diarrhea. She states she has diarrhea approximately once or twice a month. The Dawn Parsons reports it is usually after she eats a greasy or fatty meal. She denies any nausea or vomiting but she does report that she has some abdominal discomfort when she has the diarrhea. There is no report of any unexplained weight loss fevers chills nausea vomiting. The Dawn Parsons also reports that she has normal bowel movements when she does not have these attacks elicited by greasy or fatty foods. The Dawn Parsons recalls these symptoms to be present even prior to having her gallbladder out. The Dawn Parsons had her gallbladder out last year after having some right upper quadrant pain and was seen in urgent care and readily diagnosed with gallbladder symptoms. She reports that those pains have completely resolved. There is no family history of colon cancer colon polyps and the Dawn Parsons denies any previous colonoscopies.  Past Medical History:  Diagnosis Date  . 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 GERD (gastroesophageal reflux disease)    OCC    Past Surgical History:  Procedure Laterality Date  . CHOLECYSTECTOMY N/A 10/28/2015   Procedure: LAPAROSCOPIC CHOLECYSTECTOMY;  Surgeon: Jules Husbands, MD;  Location: [facility] ORS;  Service: General;  Laterality: N/A;    Prior to Admission medications   Medication Sig Start Date End Date Taking? Authorizing Provider  calcium carbonate (TUMS - DOSED IN MG ELEMENTAL CALCIUM) 500 MG chewable tablet Chew 1 tablet by mouth as needed for indigestion or heartburn.   Yes  [provider]  augmented betamethasone dipropionate (DIPROLENE-AF) 0.05 % ointment APPLY TO AFFECTED AREA ON ELBOWS TWICE A DAY UNTIL IMPROVED, THEN AS NEEDED 12/27/16   [provider]  cholestyramine light (PREVALITE) 4 g packet Take 1 packet (4 g total) by mouth 2 (two) times daily. 01/23/17   Dawn Lame, MD    Family History  Problem Relation Age of Onset  . Diabetes Father      Social History  Substance Use Topics  . Smoking status: Former Smoker    Packs/day: 0.50    Years: 5.00    Types: Cigarettes    Quit date: 10/17/2005  . Smokeless tobacco: Never Used  . Alcohol use Yes     Comment: OCC    Allergies as of 01/23/2017 - Review Complete 01/23/2017  Allergen Reaction Noted  . Shellfish allergy Other (See Comments) 09/16/2015    Review of Systems:    All systems reviewed and negative except where noted in HPI.   Physical Exam:  BP 126/78   Pulse 80   Temp 98.3 F (36.8 C) (Oral)   Ht 5\' 4"  (1.626 m)   Wt 156 lb 6.4 oz (70.9 kg)   BMI 26.85 kg/m  No LMP recorded. Dawn Parsons is postmenopausal. Psych:  Alert and cooperative. Normal mood and affect. General:   Alert,  Well-developed, well-nourished, pleasant and cooperative in NAD Head:  Normocephalic and atraumatic. Eyes:  Sclera clear, no icterus.   Conjunctiva pink. Ears:  Normal auditory acuity. Nose:  No deformity, discharge, or lesions. Mouth:  No deformity or lesions,oropharynx pink & moist. Neck:  Supple; no masses or thyromegaly. Lungs:  Respirations even and unlabored.  Clear throughout to auscultation.   No wheezes, crackles, or rhonchi. No acute distress. Heart:  Regular rate and rhythm; no murmurs, clicks, rubs, or gallops. Abdomen:  Normal bowel sounds.  No bruits.  Soft, non-tender and non-distended without masses, hepatosplenomegaly or hernias noted.  No guarding or rebound tenderness.  Negative Carnett sign.   Rectal:  Deferred.  Msk:  Symmetrical without gross deformities.   Good, equal movement & strength bilaterally. Pulses:  Normal pulses noted. Extremities:  No clubbing or edema.  No cyanosis. Neurologic:  Alert and oriented x3;  grossly normal neurologically. Skin:  Intact without significant lesions or rashes.  No jaundice. Lymph Nodes:  No significant cervical adenopathy. Psych:  Alert and cooperative. Normal mood and affect.  Imaging Studies: No results found.  Assessment and Plan:   Dawn Parsons is a 51 y.o. y/o female with intermittent diarrhea with greasy or fatty foods that she eats. The Dawn Parsons will be started on Questran for possible bilious diarrhea. She has been told to try a trial of this and see if that improves her symptoms. She will also be set up for screening colonoscopy since she has not had a colonoscopy in the past.I have discussed risks & benefits which include, but are not limited to, bleeding, infection, perforation & drug reaction.  The Dawn Parsons agrees with this plan & written consent will be obtained.     Dawn Lame, MD. Marval Regal   Note: This dictation was prepared with Dragon dictation along with smaller phrase technology. Any transcriptional errors that result from this process are unintentional.

## 2017-03-30 ENCOUNTER — Other Ambulatory Visit: Payer: Self-pay

## 2017-03-30 ENCOUNTER — Ambulatory Visit
Admission: EM | Admit: 2017-03-30 | Discharge: 2017-03-30 | Disposition: A | Discharge status: Home / Self Care | Payer: Managed Care, Other (non HMO) | Attending: Family Medicine | Admitting: Family Medicine

## 2017-03-30 ENCOUNTER — Encounter: Payer: Self-pay | Admitting: *Deleted

## 2017-03-30 DIAGNOSIS — R05 Cough: Secondary | ICD-10-CM | POA: Diagnosis not present

## 2017-03-30 DIAGNOSIS — J0100 Acute maxillary sinusitis, unspecified: Secondary | ICD-10-CM

## 2017-03-30 DIAGNOSIS — R059 Cough, unspecified: Secondary | ICD-10-CM

## 2017-03-30 LAB — RAPID STREP SCREEN (MED CTR MEBANE ONLY): Streptococcus, Group A Screen (Direct): NEGATIVE

## 2017-03-30 MED ORDER — DOXYCYCLINE HYCLATE 100 MG PO CAPS: 100.0000 mg | ORAL_CAPSULE | Freq: Two times a day (BID) | ORAL | 0 refills | Status: DC

## 2017-03-30 MED ORDER — BENZONATATE 100 MG PO CAPS: 100.0000 mg | ORAL_CAPSULE | Freq: Three times a day (TID) | ORAL | 0 refills | Status: DC | PRN

## 2017-03-30 MED ORDER — HYDROCOD POLST-CPM POLST ER 10-8 MG/5ML PO SUER: 5.0000 mL | Freq: Every evening | ORAL | 0 refills | Status: DC | PRN

## 2017-03-30 NOTE — ED Provider Notes (Signed)
[facility] ____________________________________________  Time seen: Approximately 11:26 AM  I have reviewed the triage vital signs and the nursing notes.   HISTORY  Chief Complaint Nasal Congestion; Cough; and Sore Throat   HPI Dawn Parsons is a 52 y.o. female presenting for evaluation of 1.5 weeks of runny nose, nasal congestion, cough, sinus pressure, postnasal drainage.  States cough is a dry hacking nonproductive cough that disrupts her sleep and is worse at night.  States nasal congestion has continued.  States she has had intermittent sore throat, states at initial sickness onset she had a sore throat that then resolved and returned over the last few days.  States sore throat currently is mild.  States symptoms have been unresolved with multiple over-the-counter cough and congestion medications.  Reports continues remain active.  Reports continues to overall eat and drink well.  Denies known sick contacts.  States that she believes she had a fever at initial sickness onset, but no fevers in the last few days.  Denies other aggravating or alleviating factors.  Denies chest pain, shortness of breath, abdominal pain, or rash. Denies recent sickness. Denies recent antibiotic use.   No LMP recorded. Patient is postmenopausal.    Past Medical History:  Diagnosis Date  . 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 GERD (gastroesophageal reflux disease)    OCC    Patient Active Problem List   Diagnosis Date Noted  . Bartholin's gland cyst 09/18/2016  . Chronic cholecystitis with calculus   . PSORIASIS 11/05/2008    Past Surgical History:  Procedure Laterality Date  . CHOLECYSTECTOMY N/A 10/28/2015   Procedure: LAPAROSCOPIC CHOLECYSTECTOMY;  Surgeon: Jules Husbands, MD;  Location: [facility] ORS;  Service: General;  Laterality: N/A;     No current facility-administered medications for this encounter.   Current Outpatient Medications:  .  benzonatate (TESSALON PERLES) 100 MG  capsule, Take 1 capsule (100 mg total) by mouth 3 (three) times daily as needed for cough., Disp: 15 capsule, Rfl: 0 .  chlorpheniramine-HYDROcodone (TUSSIONEX PENNKINETIC ER) 10-8 MG/5ML SUER, Take 5 mLs by mouth at bedtime as needed. do not drive or operate machinery while taking as can cause drowsiness., Disp: 75 mL, Rfl: 0 .  doxycycline (VIBRAMYCIN) 100 MG capsule, Take 1 capsule (100 mg total) by mouth 2 (two) times daily., Disp: 20 capsule, Rfl: 0  Allergies Shellfish allergy  Family History  Problem Relation Age of Onset  . Diabetes Father     Social History Social History   Tobacco Use  . Smoking status: Former Smoker    Packs/day: 0.50    Years: 5.00    Pack years: 2.50    Types: Cigarettes    Last attempt to quit: 10/17/2005    Years since quitting: 11.4  . Smokeless tobacco: Never Used  Substance Use Topics  . Alcohol use: Yes    Comment: OCC  . Drug use: No    Review of Systems Constitutional: As above.  Eyes: No visual changes. ENT: As above Cardiovascular: Denies chest pain. Respiratory: Denies shortness of breath. Gastrointestinal: No abdominal pain.   Musculoskeletal: Negative for back pain. Skin: Negative for rash.   ____________________________________________   PHYSICAL EXAM:  VITAL SIGNS: ED Triage Vitals  Enc Vitals Group     BP 03/30/17 1050 117/74     Pulse Rate 03/30/17 1050 80     Resp 03/30/17 1050 16     Temp 03/30/17 1050 98.1 F (36.7 C)     Temp  Source 03/30/17 1050 Oral     SpO2 03/30/17 1050 98 %     Weight 03/30/17 1051 150 lb (68 kg)     Height 03/30/17 1051 5\' 4"  (1.626 m)     Head Circumference --      Peak Flow --      Pain Score 03/30/17 1052 0     Pain Loc --      Pain Edu? --      Excl. in [place]? --     Constitutional: Alert and oriented. Well appearing and in no acute distress. Eyes: Conjunctivae are normal. Head: Atraumatic.Mild tenderness to palpation bilateral maxillary sinuses. No frontal sinus tenderness.  No swelling. No erythema.   Ears: no erythema, normal TMs bilaterally.   Nose: nasal congestion with bilateral nasal turbinate erythema and edema.   Mouth/Throat: Mucous membranes are [employer] pharyngeal erythema.  No tonsillar swelling or exudate.  Neck: No stridor.  No cervical spine tenderness to palpation. Hematological/Lymphatic/Immunilogical: No cervical lymphadenopathy. Cardiovascular: Normal rate, regular rhythm. Grossly normal heart sounds.  Good peripheral circulation. Respiratory: Normal respiratory effort.  No retractions. No wheezes, rales or rhonchi. Good air movement.  Occasional dry cough noted in room. Musculoskeletal: Ambulatory with steady gait. Neurologic:  Normal speech and language. No gait instability. Skin:  Skin is warm, dry and intact. No rash noted. Psychiatric: Mood and affect are normal. Speech and behavior are normal.  ___________________________________________   LABS (all labs ordered are listed, but only abnormal results are displayed)  Labs Reviewed  RAPID STREP SCREEN (NOT AT [facility])  CULTURE, GROUP A STREP [facility])     PROCEDURES Procedures    INITIAL IMPRESSION / ASSESSMENT AND PLAN / ED COURSE  Pertinent labs & imaging results that were available during my care of the patient were reviewed by me and considered in my medical decision making (see chart for details).  Well-appearing patient.  No acute distress.  Quick strep negative, will culture.  Suspect recent viral upper respiratory infection with secondary sinusitis and associated postnasal drainage cough.  Will treat patient with oral doxycycline, appearing Tessalon Perles and as needed Tussionex.  Encourage rest, fluids, supportive care.Discussed indication, risks and benefits of medications with patient.  Discussed follow up with Primary care physician this week. Discussed follow up and return parameters including no resolution or any worsening concerns. Patient verbalized understanding and  agreed to plan.   ____________________________________________   FINAL CLINICAL IMPRESSION(S) / ED DIAGNOSES  Final diagnoses:  Acute maxillary sinusitis, recurrence not specified  Cough     ED Discharge Orders        Ordered    doxycycline (VIBRAMYCIN) 100 MG capsule  2 times daily     03/30/17 1125    benzonatate (TESSALON PERLES) 100 MG capsule  3 times daily PRN     03/30/17 1125    chlorpheniramine-HYDROcodone (TUSSIONEX PENNKINETIC ER) 10-8 MG/5ML SUER  At bedtime PRN     03/30/17 1125       Note: This dictation was prepared with Dragon dictation along with smaller phrase technology. Any transcriptional errors that result from this process are unintentional.         Marylene Land, NP 03/30/17 1149

## 2017-03-30 NOTE — Discharge Instructions (Signed)
Take medication as prescribed. Rest. Drink plenty of fluids.  ° °Follow up with your primary care physician this week as needed. Return to Urgent care for new or worsening concerns.  ° °

## 2017-03-30 NOTE — ED Triage Notes (Signed)
Patient started having symptoms of cough, sore throat, fever, and nasal congestion 11 days ago. Symptoms of sore throat and fever have resolved but the nasal congestion and cough persist.

## 2017-04-02 LAB — CULTURE, GROUP A STREP (THRC)

## 2018-11-22 ENCOUNTER — Encounter: Payer: Self-pay | Admitting: Obstetrics and Gynecology

## 2018-11-22 ENCOUNTER — Ambulatory Visit (INDEPENDENT_AMBULATORY_CARE_PROVIDER_SITE_OTHER): Payer: Managed Care, Other (non HMO) | Admitting: Obstetrics and Gynecology

## 2018-11-22 ENCOUNTER — Other Ambulatory Visit: Payer: Self-pay

## 2018-11-22 VITALS — BP 140/68 | HR 73 | Ht 64.0 in | Wt 145.0 lb

## 2018-11-22 DIAGNOSIS — Z01419 Encounter for gynecological examination (general) (routine) without abnormal findings: Secondary | ICD-10-CM | POA: Diagnosis not present

## 2018-11-22 DIAGNOSIS — Z1339 Encounter for screening examination for other mental health and behavioral disorders: Secondary | ICD-10-CM

## 2018-11-22 DIAGNOSIS — Z1331 Encounter for screening for depression: Secondary | ICD-10-CM

## 2018-11-22 NOTE — Progress Notes (Signed)
Routine Annual Gynecology Examination   PCP: Patient, No Pcp Per  Chief Complaint  Patient presents with  . Gynecologic Exam    Diarrhea no PCP to discuss   History of Present Illness: Patient is a 53 y.o. G0P0000 presents for annual exam.   Menopausal bleeding: denies  Menopausal symptoms: reports mild occasional.   Breast symptoms: denies  Last pap smear: 2 years ago.  Result Normal  Last mammogram: several years ago.  Result Normal   She is sexually active. No issues with intercourse.  She continues to have issues with diarrhea.  She was referred for the same a few years and did not get an answer that helped.  She had her gall bladder removed in mid 2017.  She would like a referral to a PCP.    Past Medical History:  Diagnosis Date  . 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 GERD (gastroesophageal reflux disease)    OCC    Past Surgical History:  Procedure Laterality Date  . CHOLECYSTECTOMY N/A 10/28/2015   Procedure: LAPAROSCOPIC CHOLECYSTECTOMY;  Surgeon: Jules Husbands, MD;  Location: [facility] ORS;  Service: General;  Laterality: N/A;    Prior to Admission medications   denies    Allergies  Allergen Reactions  . Shellfish Allergy Other (See Comments)    Childhood allergy   Obstetric History: G0P0000  Social History   Socioeconomic History  . Marital status: Single    Spouse name: Not on file  . Number of children: Not on file  . Years of education: Not on file  . Highest education level: Not on file  Occupational History  . Not on file  Social Needs  . Financial resource strain: Not on file  . Food insecurity    Worry: Not on file    Inability: Not on file  . Transportation needs    Medical: Not on file    Non-medical: Not on file  Tobacco Use  . Smoking status: Former Smoker    Packs/day: 0.50    Years: 5.00    Pack years: 2.50    Types: Cigarettes    Quit date: 10/17/2005    Years since quitting: 13.1  . Smokeless tobacco: Never Used   Substance and Sexual Activity  . Alcohol use: Yes    Comment: OCC  . Drug use: No  . Sexual activity: Yes    Birth control/protection: Post-menopausal  Lifestyle  . Physical activity    Days per week: Not on file    Minutes per session: Not on file  . Stress: Not on file  Relationships  . Social Herbalist on phone: Not on file    Gets together: Not on file    Attends religious service: Not on file    Active member of club or organization: Not on file    Attends meetings of clubs or organizations: Not on file    Relationship status: Not on file  . Intimate partner violence    Fear of current or ex partner: Not on file    Emotionally abused: Not on file    Physically abused: Not on file    Forced sexual activity: Not on file  Other Topics Concern  . Not on file  Social History Narrative  . Not on file    Family History  Problem Relation Age of Onset  . Diabetes Father     Review of Systems  Constitutional: Negative.   HENT: Negative.  Eyes: Negative.   Respiratory: Negative.   Cardiovascular: Negative.   Gastrointestinal: Positive for diarrhea. Negative for abdominal pain, blood in stool, constipation, heartburn, melena, nausea and vomiting.  Genitourinary: Negative.   Musculoskeletal: Negative.   Skin: Negative.   Neurological: Negative.   Psychiatric/Behavioral: Negative.      Physical Exam Vitals: BP 140/68   Pulse 73   Ht 5\' 4"  (1.626 m)   Wt 145 lb (65.8 kg)   BMI 24.89 kg/m   Physical Exam Constitutional:      General: She is not in acute distress.    Appearance: Normal appearance. She is well-developed.  Genitourinary:     Pelvic exam was performed with patient in the lithotomy position.     Vulva, urethra, bladder and uterus normal.     No inguinal adenopathy present in the right or left side.    No signs of injury in the vagina.     No vaginal discharge, erythema, tenderness or bleeding.     No cervical motion tenderness,  discharge, lesion or polyp.     Uterus is mobile.     Uterus is not enlarged or tender.     No uterine mass detected.    Uterus is anteverted.     No right or left adnexal mass present.     Right adnexa not tender or full.     Left adnexa not tender or full.  HENT:     Head: Normocephalic and atraumatic.  Eyes:     General: No scleral icterus.    Conjunctiva/sclera: Conjunctivae normal.  Neck:     Musculoskeletal: Normal range of motion and neck supple.     Thyroid: No thyromegaly.  Cardiovascular:     Rate and Rhythm: Normal rate and regular rhythm.     Heart sounds: No murmur. No friction rub. No gallop.   Pulmonary:     Effort: Pulmonary effort is normal. No respiratory distress.     Breath sounds: Normal breath sounds. No wheezing or rales.  Chest:     Breasts:        Right: No inverted nipple, mass, nipple discharge, skin change or tenderness.        Left: No inverted nipple, mass, nipple discharge, skin change or tenderness.  Abdominal:     General: Bowel sounds are normal. There is no distension.     Palpations: Abdomen is soft. There is no mass.     Tenderness: There is no abdominal tenderness. There is no guarding or rebound.  Musculoskeletal: Normal range of motion.        General: No swelling or tenderness.  Lymphadenopathy:     Cervical: No cervical adenopathy.     Lower Body: No right inguinal adenopathy. No left inguinal adenopathy.  Neurological:     General: No focal deficit present.     Mental Status: She is alert and oriented to person, place, and time.     Cranial Nerves: No cranial nerve deficit.  Skin:    General: Skin is warm and dry.     Findings: No erythema or rash.  Psychiatric:        Mood and Affect: Mood normal.        Behavior: Behavior normal.        Judgment: Judgment normal.  Vitals signs reviewed. Exam conducted with a chaperone present.     Female chaperone present for pelvic and breast  portions of the physical exam  Results:  AUDIT Questionnaire (screen for alcoholism): negative  PHQ-9: negative   Assessment and Plan:  53 y.o. G0P0000 female here for routine annual gynecologic examination  Plan: Problem List Items Addressed This Visit    None      Screening: -- Blood pressure screen mildly elevated to today. Continue to monitor. -- Colonoscopy - not due -- Mammogram - due. Patient to call [place] to arrange. She understands that it is her responsibility to arrange this.  THIS WAS STRONGLY RECOMMENDED TO THE PATIENT AS SHE IS WELL OVERDUE. -- Weight screening: normal -- Depression screening negative (PHQ-9) -- Nutrition: normal -- cholesterol screening: will perform through PCP -- osteoporosis screening: not due -- tobacco screening: not using -- alcohol screening: AUDIT questionnaire indicates low-risk usage. -- family history of breast cancer screening: done. not at high risk. -- no evidence of domestic violence or intimate partner violence. -- STD screening: gonorrhea/chlamydia NAAT not collected per patient request. -- pap smear not collected per ASCCP guidelines -- HPV vaccination series: not eligilbe  Discussed various PCPs in the region.  Several names were given to the patient.   Prentice Docker, MD 11/22/2018 4:01 PM

## 2018-11-24 ENCOUNTER — Encounter: Payer: Self-pay | Admitting: Obstetrics and Gynecology

## 2019-02-06 ENCOUNTER — Encounter: Payer: Self-pay | Admitting: Gastroenterology

## 2019-02-06 ENCOUNTER — Other Ambulatory Visit: Payer: Self-pay

## 2019-02-06 ENCOUNTER — Ambulatory Visit: Payer: Managed Care, Other (non HMO) | Admitting: Gastroenterology

## 2019-02-06 ENCOUNTER — Ambulatory Visit (INDEPENDENT_AMBULATORY_CARE_PROVIDER_SITE_OTHER): Payer: Managed Care, Other (non HMO) | Admitting: Gastroenterology

## 2019-02-06 VITALS — BP 106/66 | HR 67 | Temp 98.2°F | Ht 64.0 in | Wt 149.4 lb

## 2019-02-06 DIAGNOSIS — R197 Diarrhea, unspecified: Secondary | ICD-10-CM | POA: Diagnosis not present

## 2019-02-06 NOTE — Progress Notes (Signed)
    Primary Care Physician: Lynnell Jude, MD  Primary Gastroenterologist:  Dr. Lucilla Lame  Chief Complaint  Patient presents with  . Follow up diarrhea    HPI: Dawn Parsons is a 53 y.o. female here who has seen me in the past for diarrhea and was started on Questran for possible bilious diarrhea.  At that time the patient was recommended to have a colonoscopy.  That was back in 2018.  The patient had denied any history of having a colonoscopy in the past. The patient did not take the Questran due to the preparation she was had sweeteners. The patient reports that she was put on Metamucil crackers by her PCP and is no longer having diarrhea.  She does report that the diarrhea is worse with raw onions but not cooked onions.  No current outpatient medications on file.   No current facility-administered medications for this visit.     Allergies as of 02/06/2019 - Review Complete 02/06/2019  Allergen Reaction Noted  . Shellfish allergy Other (See Comments) 09/16/2015    ROS:  General: Negative for anorexia, weight loss, fever, chills, fatigue, weakness. ENT: Negative for hoarseness, difficulty swallowing , nasal congestion. CV: Negative for chest pain, angina, palpitations, dyspnea on exertion, peripheral edema.  Respiratory: Negative for dyspnea at rest, dyspnea on exertion, cough, sputum, wheezing.  GI: See history of present illness. GU:  Negative for dysuria, hematuria, urinary incontinence, urinary frequency, nocturnal urination.  Endo: Negative for unusual weight change.    Physical Examination:   BP 106/66   Pulse 67   Temp 98.2 F (36.8 C) (Oral)   Ht 5\' 4"  (1.626 m)   Wt 149 lb 6.4 oz (67.8 kg)   BMI 25.64 kg/m   General: Well-nourished, well-developed in no acute distress.  Eyes: No icterus. Conjunctivae pink. Lungs: Clear to auscultation bilaterally. Non-labored. Heart: Regular rate and rhythm, no murmurs rubs or gallops.  Abdomen: Bowel sounds are normal,  nontender, nondistended, no hepatosplenomegaly or masses, no abdominal bruits or hernia , no rebound or guarding.   Extremities: No lower extremity edema. No clubbing or deformities. Neuro: Alert and oriented x 3.  Grossly intact. Skin: Warm and dry, no jaundice.   Psych: Alert and cooperative, normal mood and affect.  Labs:    Imaging Studies: No results found.  Assessment and Plan:   [name] is a 53 y.o. y/o female who comes in today with a history of of diarrhea that has been helped with increased fiber.  The patient has been told that if her symptoms come back that she should be tried on the Questran without sweeteners.  She is adverse to trying anything with artificial sweeteners in it.  The patient will also be set up for colonoscopy  for screening with possible biopsies due to her diarrhea. I have discussed risks & benefits which include, but are not limited to, bleeding, infection, perforation & drug reaction.  The patient agrees with this plan & written consent will be obtained.        Lucilla Lame, MD. Marval Regal    Note: This dictation was prepared with Dragon dictation along with smaller phrase technology. Any transcriptional errors that result from this process are unintentional.

## 2019-03-31 ENCOUNTER — Telehealth: Payer: Self-pay

## 2019-03-31 NOTE — Telephone Encounter (Signed)
Patient is calling because she states she just received a call from the Pre screening service and they told her the colonoscopy was diagnostic colonoscopy. She does not believe  it should be a diagnostic colonoscopy but a screening because she has never had one before.

## 2019-04-01 ENCOUNTER — Telehealth: Payer: Self-pay | Admitting: Gastroenterology

## 2019-04-01 NOTE — Telephone Encounter (Signed)
Pt is calling she states she needs a call regarding her procedure and it is very important

## 2019-04-02 ENCOUNTER — Other Ambulatory Visit: Payer: Self-pay

## 2019-04-02 ENCOUNTER — Other Ambulatory Visit
Admission: RE | Admit: 2019-04-02 | Discharge: 2019-04-02 | Disposition: A | Discharge status: Home / Self Care | Payer: Managed Care, Other (non HMO) | Source: Ambulatory Visit | Attending: Gastroenterology | Admitting: Gastroenterology

## 2019-04-02 DIAGNOSIS — Z01812 Encounter for preprocedural laboratory examination: Secondary | ICD-10-CM | POA: Diagnosis present

## 2019-04-02 DIAGNOSIS — Z20822 Contact with and (suspected) exposure to covid-19: Secondary | ICD-10-CM | POA: Diagnosis not present

## 2019-04-02 LAB — SARS CORONAVIRUS 2 (TAT 6-24 HRS): SARS Coronavirus 2: NEGATIVE

## 2019-04-02 NOTE — Telephone Encounter (Signed)
Contacted pt regarding diagnosis code for her upcoming colonoscopy. Previously diagnosed with diarrhea. Pt stated she is no longer experiencing symptoms. Will update referral to reflect screening.

## 2019-04-03 ENCOUNTER — Telehealth: Payer: Self-pay | Admitting: Gastroenterology

## 2019-04-03 NOTE — Telephone Encounter (Signed)
Patients bowel prep has been changed to [employer].  Pt has been advised to follow mixing instructions, and begin drinking 8 oz every 30 mins until she completes the entire contents.  Nothing to eat or drink 4 hours prior to colonoscopy.  Thanks [employer]

## 2019-04-03 NOTE — Telephone Encounter (Signed)
Pt states she has a procedure tomorrow and  Her pharmacy [facility] [address] send over a request due to the prep not being covered please call pharmacy and pt

## 2019-04-04 ENCOUNTER — Ambulatory Visit: Payer: Managed Care, Other (non HMO) | Admitting: Anesthesiology

## 2019-04-04 ENCOUNTER — Encounter
Admission: RE | Disposition: A | Discharge status: Home / Self Care | Payer: Self-pay | Source: Home / Self Care | Attending: Gastroenterology

## 2019-04-04 ENCOUNTER — Ambulatory Visit
Admission: RE | Admit: 2019-04-04 | Discharge: 2019-04-04 | Disposition: A | Discharge status: Home / Self Care | Payer: Managed Care, Other (non HMO) | Attending: Gastroenterology | Admitting: Gastroenterology

## 2019-04-04 ENCOUNTER — Other Ambulatory Visit: Payer: Self-pay

## 2019-04-04 ENCOUNTER — Encounter: Payer: Self-pay | Admitting: Gastroenterology

## 2019-04-04 DIAGNOSIS — Z87891 Personal history of nicotine dependence: Secondary | ICD-10-CM | POA: Insufficient documentation

## 2019-04-04 DIAGNOSIS — K635 Polyp of colon: Secondary | ICD-10-CM | POA: Diagnosis not present

## 2019-04-04 DIAGNOSIS — D120 Benign neoplasm of cecum: Secondary | ICD-10-CM

## 2019-04-04 DIAGNOSIS — K640 First degree hemorrhoids: Secondary | ICD-10-CM | POA: Insufficient documentation

## 2019-04-04 DIAGNOSIS — Z91013 Allergy to seafood: Secondary | ICD-10-CM | POA: Insufficient documentation

## 2019-04-04 DIAGNOSIS — R197 Diarrhea, unspecified: Secondary | ICD-10-CM

## 2019-04-04 DIAGNOSIS — Z1211 Encounter for screening for malignant neoplasm of colon: Secondary | ICD-10-CM | POA: Diagnosis present

## 2019-04-04 DIAGNOSIS — Z85828 Personal history of other malignant neoplasm of skin: Secondary | ICD-10-CM | POA: Diagnosis not present

## 2019-04-04 HISTORY — PX: COLONOSCOPY WITH PROPOFOL: SHX5780

## 2019-04-04 SURGERY — COLONOSCOPY WITH PROPOFOL
Anesthesia: General

## 2019-04-04 MED ORDER — SODIUM CHLORIDE 0.9 % IV SOLN
INTRAVENOUS | Status: DC
  Administered 2019-04-04: 08:00:00 1000 mL via INTRAVENOUS

## 2019-04-04 MED ORDER — PROPOFOL 10 MG/ML IV BOLUS
INTRAVENOUS | Status: DC | PRN
  Administered 2019-04-04: 70 mg via INTRAVENOUS

## 2019-04-04 MED ORDER — PROPOFOL 500 MG/50ML IV EMUL
INTRAVENOUS | Status: DC | PRN
  Administered 2019-04-04: 140 ug/kg/min via INTRAVENOUS

## 2019-04-04 NOTE — Transfer of Care (Signed)
Immediate Anesthesia Transfer of Care Note  Patient: Dawn Parsons  Procedure(s) Performed: COLONOSCOPY WITH PROPOFOL (N/A )  Patient Location: PACU  Anesthesia Type:General  Level of Consciousness: sedated  Airway & Oxygen Therapy: Patient Spontanous Breathing  Post-op Assessment: Report given to RN and Post -op Vital signs reviewed and stable  Post vital signs: Reviewed and stable  Last Vitals:  Vitals Value Taken Time  BP 124/71 04/04/19 0916  Temp    Pulse 73 04/04/19 0918  Resp 18 04/04/19 0918  SpO2 100 % 04/04/19 0918  Vitals shown include unvalidated device data.  Last Pain:  Vitals:   04/04/19 0906  TempSrc:   PainSc: 0-No pain         Complications: No apparent anesthesia complications

## 2019-04-04 NOTE — Anesthesia Postprocedure Evaluation (Signed)
Anesthesia Post Note  Patient: Dawn Parsons  Procedure(s) Performed: COLONOSCOPY WITH PROPOFOL (N/A )  Patient location during evaluation: Endoscopy Anesthesia Type: General Level of consciousness: awake and alert and oriented Pain management: pain level controlled Vital Signs Assessment: post-procedure vital signs reviewed and stable Respiratory status: spontaneous breathing, nonlabored ventilation and respiratory function stable Cardiovascular status: blood pressure returned to baseline and stable Postop Assessment: no signs of nausea or vomiting Anesthetic complications: no     Last Vitals:  Vitals:   04/04/19 0915 04/04/19 0930  BP: 124/71 111/60  Pulse: 76 74  Resp: 20 16  Temp:    SpO2: 100% 100%    Last Pain:  Vitals:   04/04/19 0906  TempSrc:   PainSc: 0-No pain                 Noelle Sease

## 2019-04-04 NOTE — Op Note (Signed)
[facility] Patient Name: Dawn Parsons Procedure Date: 04/04/2019 8:25 AM MRN: [id number] Account #: [id number] Date of Birth: June 04, 1965 Admit Type: Outpatient Age: 54 Room: [facility] ENDO ROOM 4 Gender: Female Note Status: Finalized Procedure:             Colonoscopy Indications:           Screening for colorectal malignant neoplasm Providers:             Lucilla Lame MD, MD Medicines:             Propofol per Anesthesia Complications:         No immediate complications. Procedure:             Pre-Anesthesia Assessment:                        - Prior to the procedure, a History and Physical was                         performed, and patient medications and allergies were                         reviewed. The patient's tolerance of previous                         anesthesia was also reviewed. The risks and benefits                         of the procedure and the sedation options and risks                         were discussed with the patient. All questions were                         answered, and informed consent was obtained. Prior                         Anticoagulants: The patient has taken no previous                         anticoagulant or antiplatelet agents. ASA Grade                         Assessment: II - A patient with mild systemic disease.                         After reviewing the risks and benefits, the patient                         was deemed in satisfactory condition to undergo the                         procedure.                        After obtaining informed consent, the colonoscope was                         passed under direct vision. Throughout the procedure,  the patient's blood pressure, pulse, and oxygen                         saturations were monitored continuously. The                         Colonoscope was introduced through the anus and                         advanced to the the cecum,  identified by appendiceal                         orifice and ileocecal valve. The colonoscopy was                         performed without difficulty. The patient tolerated                         the procedure well. The quality of the bowel                         preparation was excellent. Findings:      A 5 mm polyp was found in the cecum. The polyp was sessile. The polyp       was removed with a cold snare. Resection and retrieval were complete.      Two sessile polyps were found in the sigmoid colon. The polyps were 2 to       3 mm in size. These polyps were removed with a cold biopsy forceps.       Resection and retrieval were complete.      Non-bleeding internal hemorrhoids were found during retroflexion. The       hemorrhoids were Grade I (internal hemorrhoids that do not prolapse). Impression:            - One 5 mm polyp in the cecum, removed with a cold                         snare. Resected and retrieved.                        - Two 2 to 3 mm polyps in the sigmoid colon, removed                         with a cold biopsy forceps. Resected and retrieved.                        - Non-bleeding internal hemorrhoids. Recommendation:        - Discharge patient to home.                        - Resume previous diet.                        - Continue present medications.                        - Await pathology results.                        -  Repeat colonoscopy in 5 years if polyp adenoma and                         10 years if hyperplastic Procedure Code(s):     --- Professional ---                        [phone], Colonoscopy, flexible; with removal of                         tumor(s), polyp(s), or other lesion(s) by snare                         technique                        45380, 1, Colonoscopy, flexible; with biopsy, single                         or multiple Diagnosis Code(s):     --- Professional ---                        Z12.11, Encounter for screening for malignant  neoplasm                         of colon                        K63.5, Polyp of colon CPT copyright 2019 American Medical Association. All rights reserved. The codes documented in this report are preliminary and upon coder review may  be revised to meet current compliance requirements. Lucilla Lame MD, MD 04/04/2019 9:03:42 AM This report has been signed electronically. Number of Addenda: 0 Note Initiated On: 04/04/2019 8:25 AM Scope Withdrawal Time: 0 hours 11 minutes 7 seconds  Total Procedure Duration: 0 hours 18 minutes 2 seconds  Estimated Blood Loss:  Estimated blood loss: none.      [facility]

## 2019-04-04 NOTE — Anesthesia Preprocedure Evaluation (Signed)
Anesthesia Evaluation  Patient identified by MRN, date of birth, ID band Patient awake    Reviewed: Allergy & Precautions, NPO status , Patient's Chart, lab work & pertinent test results  History of Anesthesia Complications Negative for: history of anesthetic complications  Airway Mallampati: II  TM Distance: >3 FB Neck ROM: Full    Dental no notable dental hx.    Pulmonary neg sleep apnea, neg COPD, former smoker,    breath sounds clear to auscultation- rhonchi (-) wheezing      Cardiovascular (-) hypertension(-) CAD, (-) Past MI, (-) Cardiac Stents and (-) CABG  Rhythm:Regular Rate:Normal - Systolic murmurs and - Diastolic murmurs    Neuro/Psych neg Seizures negative neurological ROS  negative psych ROS   GI/Hepatic Neg liver ROS, GERD  ,  Endo/Other  negative endocrine ROSneg diabetes  Renal/GU negative Renal ROS     Musculoskeletal negative musculoskeletal ROS (+)   Abdominal (+) - obese,   Peds  Hematology negative hematology ROS (+)   Anesthesia Other Findings Past Medical History: No date: 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 No date: GERD (gastroesophageal reflux disease)     Comment:  OCC   Reproductive/Obstetrics                             Anesthesia Physical Anesthesia Plan  ASA: II  Anesthesia Plan: General   Post-op Pain Management:    Induction: Intravenous  PONV Risk Score and Plan: 2 and Propofol infusion  Airway Management Planned: Natural Airway  Additional Equipment:   Intra-op Plan:   Post-operative Plan:   Informed Consent: I have reviewed the patients History and Physical, chart, labs and discussed the procedure including the risks, benefits and alternatives for the proposed anesthesia with the patient or authorized representative who has indicated his/her understanding and acceptance.     Dental advisory given  Plan Discussed with:  CRNA and Anesthesiologist  Anesthesia Plan Comments:         Anesthesia Quick Evaluation

## 2019-04-04 NOTE — H&P (Signed)
Lucilla Lame, MD [address] [place], [place] [address] Phone: [phone] Fax : [phone]  Primary Care Physician:  Lynnell Jude, MD Primary Gastroenterologist:  Dr. Allen Norris  Pre-Procedure History & Physical: HPI:  Dawn Parsons is a 54 y.o. female is here for a screening colonoscopy.   Past Medical History:  Diagnosis Date  . Cancer ([place])    FACE-SQUAMOUS CELL  . GERD (gastroesophageal reflux disease)    OCC    Past Surgical History:  Procedure Laterality Date  . CHOLECYSTECTOMY N/A 10/28/2015   Procedure: LAPAROSCOPIC CHOLECYSTECTOMY;  Surgeon: Jules Husbands, MD;  Location: [facility] ORS;  Service: General;  Laterality: N/A;    Prior to Admission medications   Not on File    Allergies as of 02/06/2019 - Review Complete 02/06/2019  Allergen Reaction Noted  . Shellfish allergy Other (See Comments) 09/16/2015    Family History  Problem Relation Age of Onset  . Diabetes Father     Social History   Socioeconomic History  . Marital status: Single    Spouse name: Not on file  . Number of children: Not on file  . Years of education: Not on file  . Highest education level: Not on file  Occupational History  . Not on file  Tobacco Use  . Smoking status: Former Smoker    Packs/day: 0.50    Years: 5.00    Pack years: 2.50    Types: Cigarettes    Quit date: 10/17/2005    Years since quitting: 13.4  . Smokeless tobacco: Never Used  Substance and Sexual Activity  . Alcohol use: Yes    Comment: OCC  . Drug use: Never  . Sexual activity: Yes    Birth control/protection: Post-menopausal  Other Topics Concern  . Not on file  Social History Narrative  . Not on file   Social Determinants of Health   Financial Resource Strain:   . Difficulty of Paying Living Expenses: Not on file  Food Insecurity:   . Worried About Charity fundraiser in the Last Year: Not on file  . Ran Out of Food in the Last Year: Not on file  Transportation Needs:   . Lack of  Transportation (Medical): Not on file  . Lack of Transportation (Non-Medical): Not on file  Physical Activity:   . Days of Exercise per Week: Not on file  . Minutes of Exercise per Session: Not on file  Stress:   . Feeling of Stress : Not on file  Social Connections:   . Frequency of Communication with Friends and Family: Not on file  . Frequency of Social Gatherings with Friends and Family: Not on file  . Attends Religious Services: Not on file  . Active Member of Clubs or Organizations: Not on file  . Attends Archivist Meetings: Not on file  . Marital Status: Not on file  Intimate Partner Violence:   . Fear of Current or Ex-Partner: Not on file  . Emotionally Abused: Not on file  . Physically Abused: Not on file  . Sexually Abused: Not on file    Review of Systems: See HPI, otherwise negative ROS  Physical Exam: BP 116/74   Pulse 73   Temp (!) 96.9 F (36.1 C) (Temporal)   Resp 16   Ht 5\' 4"  (1.626 m)   Wt 65.9 kg   SpO2 99%   BMI 24.94 kg/m  General:   Alert,  pleasant and cooperative in NAD Head:  Normocephalic and atraumatic. Neck:  Supple; no masses or thyromegaly. Lungs:  Clear throughout to auscultation.    Heart:  Regular rate and rhythm. Abdomen:  Soft, nontender and nondistended. Normal bowel sounds, without guarding, and without rebound.   Neurologic:  Alert and  oriented x4;  grossly normal neurologically.  Impression/Plan: Dawn Parsons is now here to undergo a screening colonoscopy.  Risks, benefits, and alternatives regarding colonoscopy have been reviewed with the patient.  Questions have been answered.  All parties agreeable.

## 2019-04-07 LAB — SURGICAL PATHOLOGY

## 2019-04-08 ENCOUNTER — Encounter: Payer: Self-pay | Admitting: Gastroenterology

## 2019-07-10 ENCOUNTER — Ambulatory Visit: Payer: Managed Care, Other (non HMO) | Attending: Internal Medicine

## 2019-07-10 DIAGNOSIS — Z23 Encounter for immunization: Secondary | ICD-10-CM

## 2019-07-10 NOTE — Progress Notes (Signed)
   Covid-19 Vaccination Clinic  Name:  Dawn Parsons    MRN: [id number] DOB: Nov 13, 1965  07/10/2019  [name] was observed post Covid-19 immunization for 15 minutes without incident. She was provided with Vaccine Information Sheet and instruction to access the V-Safe system.   [name] was instructed to call 911 with any severe reactions post vaccine: Marland Kitchen Difficulty breathing  . Swelling of face and throat  . A fast heartbeat  . A bad rash all over body  . Dizziness and weakness   Immunizations Administered    Name Date Dose VIS Date Route   Pfizer COVID-19 Vaccine 07/10/2019  8:18 AM 0.3 mL 03/07/2019 Intramuscular   Manufacturer: [place]   Lot: [id number]   [place]: [id number]

## 2019-08-04 ENCOUNTER — Ambulatory Visit: Payer: Managed Care, Other (non HMO) | Attending: Internal Medicine

## 2019-08-04 DIAGNOSIS — Z23 Encounter for immunization: Secondary | ICD-10-CM

## 2019-08-04 NOTE — Progress Notes (Signed)
   Covid-19 Vaccination Clinic  Name:  MAKAIYA BARRACO    MRN: [id number] DOB: [birth year]  08/04/2019  Ms. Sachs was observed post Covid-19 immunization for 15 minutes without incident. She was provided with Vaccine Information Sheet and instruction to access the V-Safe system.   Ms. Nambo was instructed to call 911 with any severe reactions post vaccine: Marland Kitchen Difficulty breathing  . Swelling of face and throat  . A fast heartbeat  . A bad rash all over body  . Dizziness and weakness   Immunizations Administered    Name Date Dose VIS Date Route   Pfizer COVID-19 Vaccine 08/04/2019  8:11 AM 0.3 mL 05/21/2018 Intramuscular   Manufacturer: [place]   Lot: [id number]   [facility]: [facility]  Name:  AUNISTY EGGEBRECHT    MRN: [id number] DOB: 1965/06/06  08/04/2019  Ms. Sano was observed post Covid-19 immunization for 15 minutes without incident. She was provided with Vaccine Information Sheet and instruction to access the V-Safe system.   Ms. Patrick was instructed to call 911 with any severe reactions post vaccine: Marland Kitchen Difficulty breathing  . Swelling of face and throat  . A fast heartbeat  . A bad rash all over body  . Dizziness and weakness   Immunizations Administered    Name Date Dose VIS Date Route   Pfizer COVID-19 Vaccine 08/04/2019  8:11 AM 0.3 mL 05/21/2018 Intramuscular   Manufacturer: [place]   Lot: [id number]   [place]: [id number]

## 2020-10-08 ENCOUNTER — Other Ambulatory Visit: Payer: Self-pay | Admitting: Family Medicine

## 2020-10-08 ENCOUNTER — Other Ambulatory Visit: Payer: Self-pay | Admitting: Internal Medicine

## 2020-12-28 ENCOUNTER — Other Ambulatory Visit: Payer: Self-pay | Admitting: Family Medicine

## 2020-12-28 DIAGNOSIS — Z1231 Encounter for screening mammogram for malignant neoplasm of breast: Secondary | ICD-10-CM

## 2021-01-25 ENCOUNTER — Ambulatory Visit
Admission: RE | Admit: 2021-01-25 | Discharge: 2021-01-25 | Disposition: A | Discharge status: Home / Self Care | Payer: 59 | Source: Ambulatory Visit | Attending: Family Medicine | Admitting: Family Medicine

## 2021-01-25 ENCOUNTER — Other Ambulatory Visit: Payer: Self-pay

## 2021-01-25 DIAGNOSIS — Z1231 Encounter for screening mammogram for malignant neoplasm of breast: Secondary | ICD-10-CM | POA: Insufficient documentation

## 2021-02-08 ENCOUNTER — Other Ambulatory Visit: Payer: Self-pay

## 2021-02-08 ENCOUNTER — Ambulatory Visit (INDEPENDENT_AMBULATORY_CARE_PROVIDER_SITE_OTHER): Payer: 59 | Admitting: Obstetrics and Gynecology

## 2021-02-08 ENCOUNTER — Encounter: Payer: Self-pay | Admitting: Obstetrics and Gynecology

## 2021-02-08 VITALS — BP 110/70 | Ht 64.0 in | Wt 153.4 lb

## 2021-02-08 DIAGNOSIS — Z01419 Encounter for gynecological examination (general) (routine) without abnormal findings: Secondary | ICD-10-CM

## 2021-02-08 DIAGNOSIS — Z1331 Encounter for screening for depression: Secondary | ICD-10-CM

## 2021-02-08 DIAGNOSIS — Z1339 Encounter for screening examination for other mental health and behavioral disorders: Secondary | ICD-10-CM | POA: Diagnosis not present

## 2021-02-08 NOTE — Progress Notes (Signed)
Routine Annual Gynecology Examination   PCP: Lynnell Jude, MD  Chief Complaint  Patient presents with   Annual Exam    No complaints   History of Present Illness: Patient is a 55 y.o. G0P0000 presents for annual exam.   Menopausal bleeding: denies  Menopausal symptoms:   Breast symptoms: denies  Last pap smear: 4 years ago.  Result Normal  Last mammogram: 01/2021.  Result Normal   She is sexually active. No issues with intercourse. She has a bump on the inside of her labium on her right side.   Past Medical History:  Diagnosis Date   Cancer ([place])    FACE-SQUAMOUS CELL   GERD (gastroesophageal reflux disease)    OCC    Past Surgical History:  Procedure Laterality Date   CHOLECYSTECTOMY N/A 10/28/2015   Procedure: LAPAROSCOPIC CHOLECYSTECTOMY;  Surgeon: Jules Husbands, MD;  Location: [facility] ORS;  Service: General;  Laterality: N/A;   COLONOSCOPY WITH PROPOFOL N/A 04/04/2019   Procedure: COLONOSCOPY WITH PROPOFOL;  Surgeon: Lucilla Lame, MD;  Location: [facility] ENDOSCOPY;  Service: Endoscopy;  Laterality: N/A;   Prior to Admission medications   Denies    Allergies  Allergen Reactions   Shellfish Allergy Other (See Comments)    Childhood allergy   Obstetric History: G0P0000  Social History   Socioeconomic History   Marital status: Single    Spouse name: Not on file   Number of children: Not on file   Years of education: Not on file   Highest education level: Not on file  Occupational History   Not on file  Tobacco Use   Smoking status: Former    Packs/day: 0.50    Years: 5.00    Pack years: 2.50    Types: Cigarettes    Quit date: 10/17/2005    Years since quitting: 15.3   Smokeless tobacco: Never  Vaping Use   Vaping Use: Never used  Substance and Sexual Activity   Alcohol use: Yes    Comment: OCC   Drug use: Never   Sexual activity: Yes    Birth control/protection: Post-menopausal  Other Topics Concern   Not on file  Social History Narrative   Not  on file   Social Determinants of Health   Financial Resource Strain: Not on file  Food Insecurity: Not on file  Transportation Needs: Not on file  Physical Activity: Not on file  Stress: Not on file  Social Connections: Not on file  Intimate Partner Violence: Not on file    Family History  Problem Relation Age of Onset   Diabetes Father    Breast cancer Neg Hx     Review of Systems  Constitutional: Negative.   HENT: Negative.    Eyes: Negative.   Respiratory: Negative.    Cardiovascular: Negative.   Gastrointestinal: Negative.  Negative for abdominal pain, blood in stool, constipation, diarrhea, heartburn, melena, nausea and vomiting.  Genitourinary: Negative.   Musculoskeletal: Negative.   Skin: Negative.   Neurological: Negative.   Psychiatric/Behavioral: Negative.      Physical Exam Vitals: BP 110/70   Ht 5\' 4"  (1.626 m)   Wt 153 lb 6.4 oz (69.6 kg)   BMI 26.33 kg/m   Physical Exam Constitutional:      General: She is not in acute distress.    Appearance: Normal appearance. She is well-developed.  Genitourinary:     Vulva and bladder normal.     No vaginal discharge, erythema, tenderness or bleeding.  Right Adnexa: not tender, not full and no mass present.    Left Adnexa: not tender, not full and no mass present.    No cervical motion tenderness, discharge, lesion or polyp.     Uterus is not enlarged or tender.     No uterine mass detected.    Pelvic exam was performed with patient in the lithotomy position.  Breasts:    Right: No inverted nipple, mass, nipple discharge, skin change or tenderness.     Left: No inverted nipple, mass, nipple discharge, skin change or tenderness.  HENT:     Head: Normocephalic and atraumatic.  Eyes:     General: No scleral icterus.    Conjunctiva/sclera: Conjunctivae normal.  Neck:     Thyroid: No thyromegaly.  Cardiovascular:     Rate and Rhythm: Normal rate and regular rhythm.     Heart sounds: No murmur heard.    No friction rub. No gallop.  Pulmonary:     Effort: Pulmonary effort is normal. No respiratory distress.     Breath sounds: Normal breath sounds. No wheezing or rales.  Abdominal:     General: Bowel sounds are normal. There is no distension.     Palpations: Abdomen is soft. There is no mass.     Tenderness: There is no abdominal tenderness. There is no guarding or rebound.  Musculoskeletal:        General: No swelling or tenderness. Normal range of motion.     Cervical back: Normal range of motion and neck supple.  Lymphadenopathy:     Cervical: No cervical adenopathy.     Lower Body: No right inguinal adenopathy. No left inguinal adenopathy.  Neurological:     General: No focal deficit present.     Mental Status: She is alert and oriented to person, place, and time.     Cranial Nerves: No cranial nerve deficit.  Skin:    General: Skin is warm and dry.     Findings: No erythema or rash.  Psychiatric:        Mood and Affect: Mood normal.        Behavior: Behavior normal.        Judgment: Judgment normal.  Vitals reviewed. Exam conducted with a chaperone present.    Female chaperone present for pelvic and breast  portions of the physical exam  Results: AUDIT Questionnaire (screen for alcoholism): negative 5 PHQ-9: negative 0   Assessment and Plan:  55 y.o. Dawn Parsons female here for routine annual gynecologic examination  Plan: Problem List Items Addressed This Visit   None Visit Diagnoses     Women's annual routine gynecological examination    -  Primary   Screening for depression       Screening for alcoholism          Screening: -- Blood pressure screen  mildly elevated to today. Continue to monitor. -- Colonoscopy - not due -- Mammogram - not due   -- Weight screening: normal -- Depression screening negative (PHQ-9) -- Nutrition: normal -- cholesterol screening:  will perform through PCP -- osteoporosis screening: not due -- tobacco screening: not using --  alcohol screening: AUDIT questionnaire indicates low-risk usage. -- family history of breast cancer screening: done. not at high risk. -- no evidence of domestic violence or intimate partner violence. -- STD screening: gonorrhea/chlamydia NAAT not collected per patient request. -- pap smear not collected per ASCCP guidelines -- HPV vaccination series: not eligilbe  Prentice Docker, MD 11/22/2018 4:01 PM

## 2021-04-01 DIAGNOSIS — Z23 Encounter for immunization: Secondary | ICD-10-CM | POA: Diagnosis not present

## 2021-04-13 DIAGNOSIS — R208 Other disturbances of skin sensation: Secondary | ICD-10-CM | POA: Diagnosis not present

## 2021-04-13 DIAGNOSIS — L538 Other specified erythematous conditions: Secondary | ICD-10-CM | POA: Diagnosis not present

## 2021-04-13 DIAGNOSIS — L728 Other follicular cysts of the skin and subcutaneous tissue: Secondary | ICD-10-CM | POA: Diagnosis not present

## 2021-12-14 DIAGNOSIS — D2262 Melanocytic nevi of left upper limb, including shoulder: Secondary | ICD-10-CM | POA: Diagnosis not present

## 2021-12-14 DIAGNOSIS — D225 Melanocytic nevi of trunk: Secondary | ICD-10-CM | POA: Diagnosis not present

## 2021-12-14 DIAGNOSIS — D2272 Melanocytic nevi of left lower limb, including hip: Secondary | ICD-10-CM | POA: Diagnosis not present

## 2021-12-14 DIAGNOSIS — L570 Actinic keratosis: Secondary | ICD-10-CM | POA: Diagnosis not present

## 2021-12-14 DIAGNOSIS — D2261 Melanocytic nevi of right upper limb, including shoulder: Secondary | ICD-10-CM | POA: Diagnosis not present

## 2021-12-14 DIAGNOSIS — L400 Psoriasis vulgaris: Secondary | ICD-10-CM | POA: Diagnosis not present

## 2022-08-23 DIAGNOSIS — H524 Presbyopia: Secondary | ICD-10-CM | POA: Diagnosis not present

## 2022-08-23 DIAGNOSIS — H2513 Age-related nuclear cataract, bilateral: Secondary | ICD-10-CM | POA: Diagnosis not present

## 2022-11-21 DIAGNOSIS — R1013 Epigastric pain: Secondary | ICD-10-CM | POA: Diagnosis not present

## 2022-11-21 DIAGNOSIS — R7309 Other abnormal glucose: Secondary | ICD-10-CM | POA: Diagnosis not present

## 2022-11-21 DIAGNOSIS — E782 Mixed hyperlipidemia: Secondary | ICD-10-CM | POA: Diagnosis not present

## 2022-11-23 ENCOUNTER — Encounter: Payer: Self-pay | Admitting: Family Medicine

## 2023-01-01 ENCOUNTER — Ambulatory Visit: Payer: 59 | Admitting: Gastroenterology

## 2023-01-01 ENCOUNTER — Encounter: Payer: Self-pay | Admitting: Gastroenterology

## 2023-01-01 VITALS — BP 135/81 | HR 71 | Temp 97.9°F | Ht 64.0 in | Wt 157.0 lb

## 2023-01-01 DIAGNOSIS — R1013 Epigastric pain: Secondary | ICD-10-CM | POA: Diagnosis not present

## 2023-01-01 DIAGNOSIS — R1310 Dysphagia, unspecified: Secondary | ICD-10-CM

## 2023-01-01 DIAGNOSIS — G8929 Other chronic pain: Secondary | ICD-10-CM | POA: Diagnosis not present

## 2023-01-01 NOTE — Progress Notes (Signed)
Gastroenterology Consultation  Referring Provider:     Dortha Kern, MD Primary Care Physician:  Dortha Kern, MD Primary Gastroenterologist:  Dr. Servando Snare     Reason for Consultation:     Abdominal pain and dysphagia        HPI:   Dawn Parsons is a 57 y.o. y/o female referred for consultation & management of abdominal pain and dysphagia by Dr. Quillian Quince, Doreene Nest, MD. This patient comes in after seeing her primary care provider back in August with a report of abdominal pain.  It was reported that the patient had her gallbladder out in 2018 and now has abdominal pain.  She reports that the pain is in the epigastric area and can last 15 minutes and she has tried to avoid fatty foods.  She states that vomiting helps her symptoms and usually happens approximately once a month and comes with the pain. The patient had seen me in the past for diarrhea and continues to have diarrhea about once a month as reported to her primary care provider. There is also been a report of dysphagia with the dysphagia reported as a piece of solid food of varying types going down the "wrong pipe". The patient reports that her symptoms have not been present since she started her omeprazole.  The patient states that she will sometimes wake up at night and drink water and it will come right back up.  She does not have issues with big bulky food that is much as she does with small food and liquids.  Past Medical History:  Diagnosis Date   Cancer (HCC)    FACE-SQUAMOUS CELL   GERD (gastroesophageal reflux disease)    OCC    Past Surgical History:  Procedure Laterality Date   CHOLECYSTECTOMY N/A 10/28/2015   Procedure: LAPAROSCOPIC CHOLECYSTECTOMY;  Surgeon: Leafy Ro, MD;  Location: [facility];  Service: General;  Laterality: N/A;   COLONOSCOPY WITH PROPOFOL N/A 04/04/2019   Procedure: COLONOSCOPY WITH PROPOFOL;  Surgeon: Midge Minium, MD;  Location: [facility];  Service: Endoscopy;  Laterality: N/A;    Prior to  Admission medications   Not on File    Family History  Problem Relation Age of Onset   Diabetes Father    Breast cancer Neg Hx      Social History   Tobacco Use   Smoking status: Former    Current packs/day: 0.00    Average packs/day: 0.5 packs/day for 5.0 years (2.5 ttl pk-yrs)    Types: Cigarettes    Start date: 10/17/2000    Quit date: 10/17/2005    Years since quitting: 17.2   Smokeless tobacco: Never  Vaping Use   Vaping status: Never Used  Substance Use Topics   Alcohol use: Yes    Comment: OCC   Drug use: Never    Allergies as of 01/01/2023 - Review Complete 02/08/2021  Allergen Reaction Noted   Shellfish allergy Other (See Comments) 09/16/2015    Review of Systems:    All systems reviewed and negative except where noted in HPI.   Physical Exam:  There were no vitals taken for this visit. No LMP recorded. Patient is postmenopausal. General:   Alert,  Well-developed, well-nourished, pleasant and cooperative in NAD Head:  Normocephalic and atraumatic. Eyes:  Sclera clear, no icterus.   Conjunctiva pink. Ears:  Normal auditory acuity. Neck:  Supple; no masses or thyromegaly. Lungs:  Respirations even and unlabored.  Clear throughout to auscultation.   No  wheezes, crackles, or rhonchi. No acute distress. Heart:  Regular rate and rhythm; no murmurs, clicks, rubs, or gallops. Abdomen:  Normal bowel sounds.  No bruits.  Soft, non-tender and non-distended without masses, hepatosplenomegaly or hernias noted.  No guarding or rebound tenderness.  Negative Carnett sign.   Rectal:  Deferred.  Pulses:  Normal pulses noted. Extremities:  No clubbing or edema.  No cyanosis. Neurologic:  Alert and oriented x3;  grossly normal neurologically. Skin:  Intact without significant lesions or rashes.  No jaundice. Lymph Nodes:  No significant cervical adenopathy. Psych:  Alert and cooperative. Normal mood and affect.  Imaging Studies: No results found.  Assessment and Plan:    Dawn Parsons is a 57 y.o. y/o female who comes in today with a history of intermittent diarrhea and Donnell pain that resulted in vomiting.  The pain is in the epigastric area.  The patient has been told that because of the dysphagia she should be set up for an EGD.  The patient also has been told that she should take the omeprazole for total of 8 weeks to see if her symptoms stay away.  If they return after she stops the medication then it is likely related to acid.  The patient would like to hold off until the beginning of the year to schedule any procedures.  The patient has been explained the plan and agrees with it.    Midge Minium, MD. Clementeen Graham    Note: This dictation was prepared with Dragon dictation along with smaller phrase technology. Any transcriptional errors that result from this process are unintentional.

## 2023-05-04 IMAGING — MG MM DIGITAL SCREENING BILAT W/ TOMO AND CAD
8 series · 8 of 24 positions shown · non-contrast
Comparison: Previous exam(s).

CLINICAL DATA: Screening.

EXAM:
DIGITAL SCREENING BILATERAL MAMMOGRAM WITH TOMOSYNTHESIS AND CAD
TECHNIQUE: Bilateral screening digital craniocaudal and mediolateral oblique
mammograms were obtained. Bilateral screening digital breast
tomosynthesis was performed. The images were evaluated with
computer-aided detection.

[L MLO synth-2D]
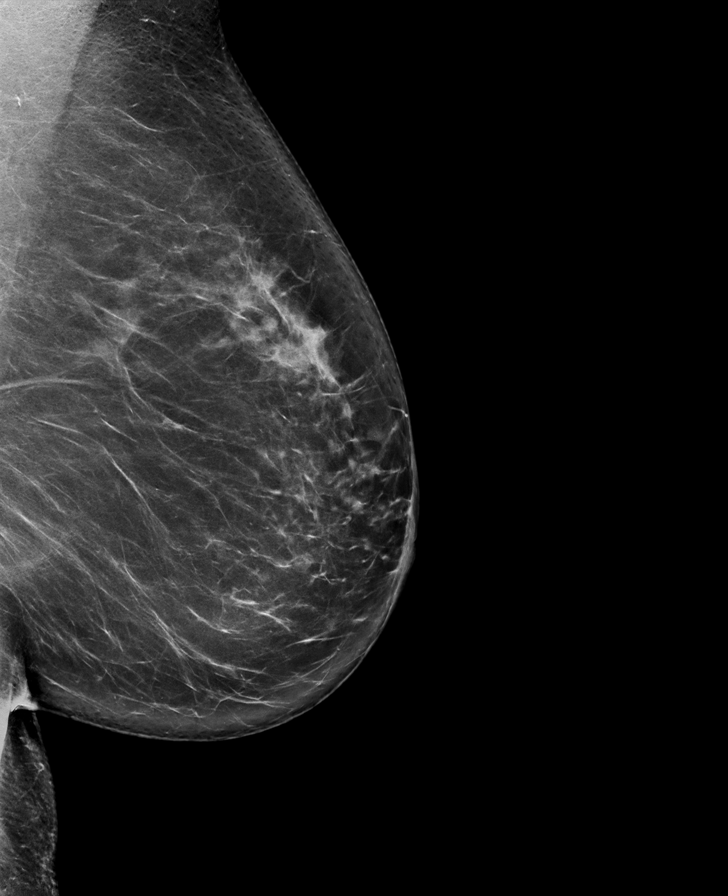

[L CC synth-2D]
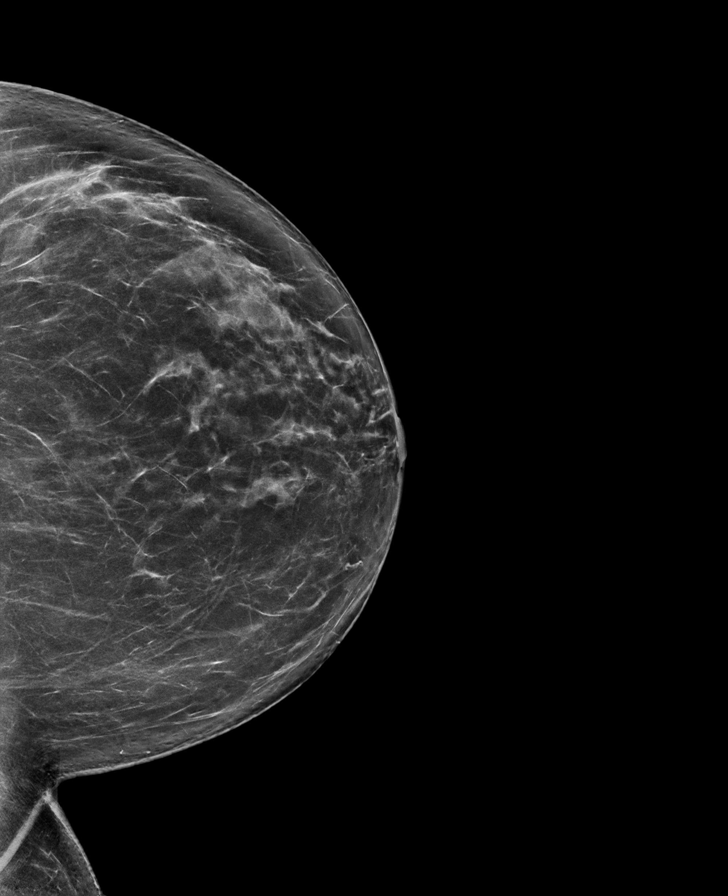

[R CC synth-2D]
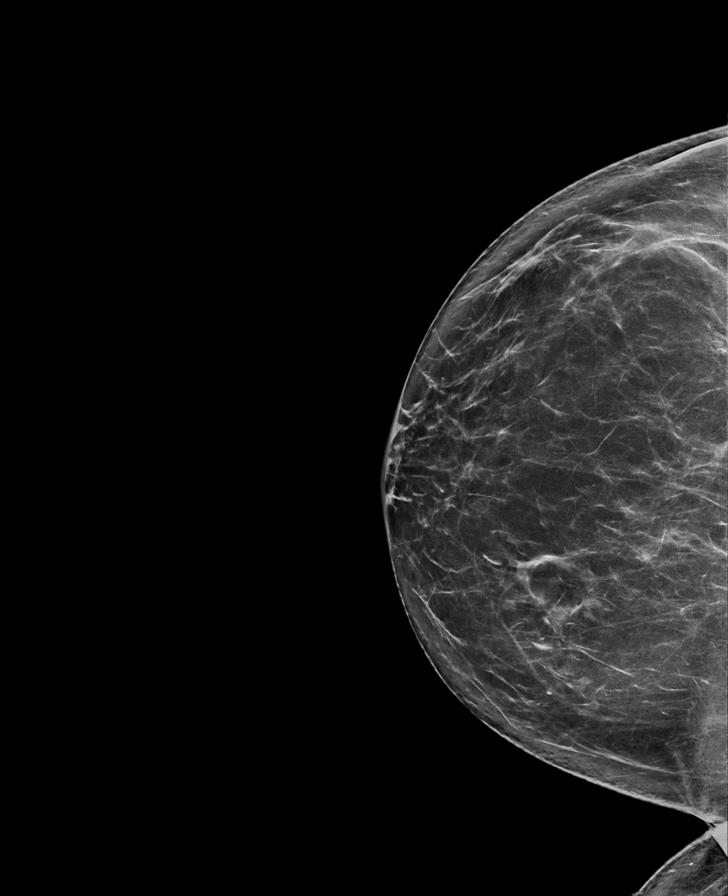

[R MLO synth-2D]
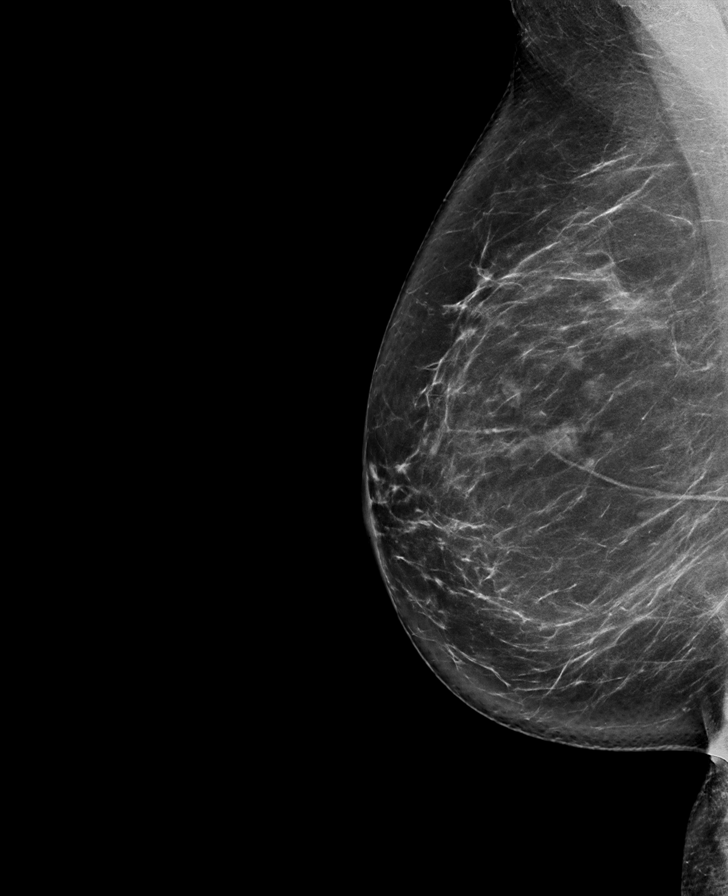

[L MLO tomo · tomo slice 43/86.0]
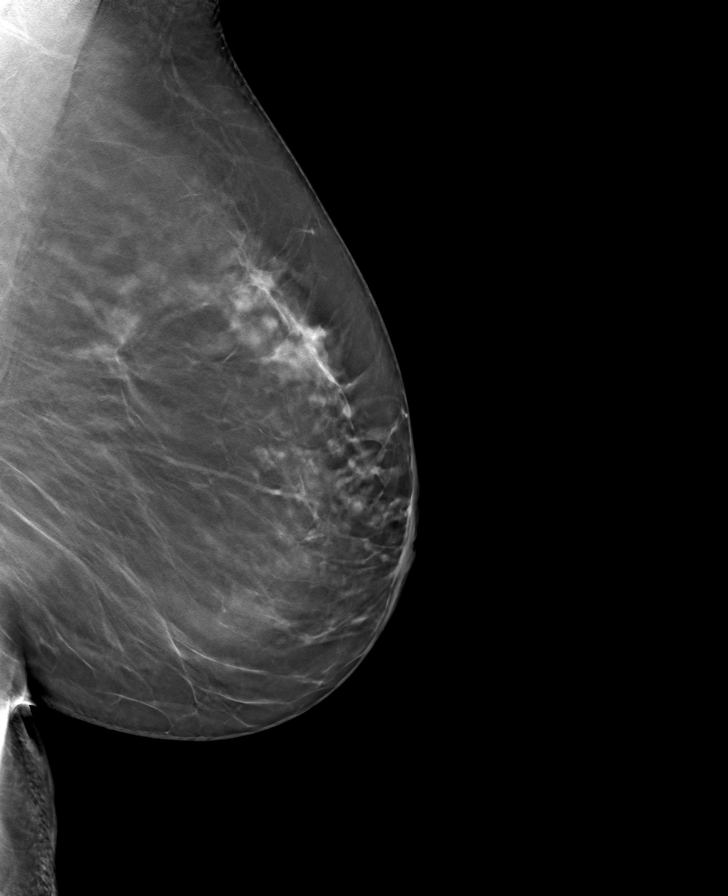

[L CC tomo · tomo slice 39/78.0]
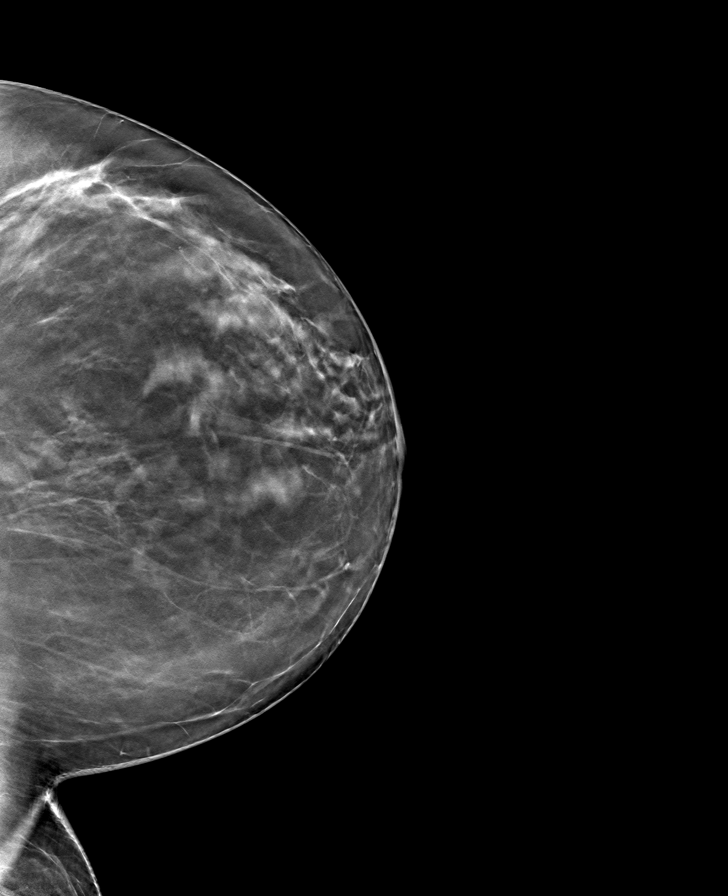

[R MLO tomo · tomo slice 43/84.0]
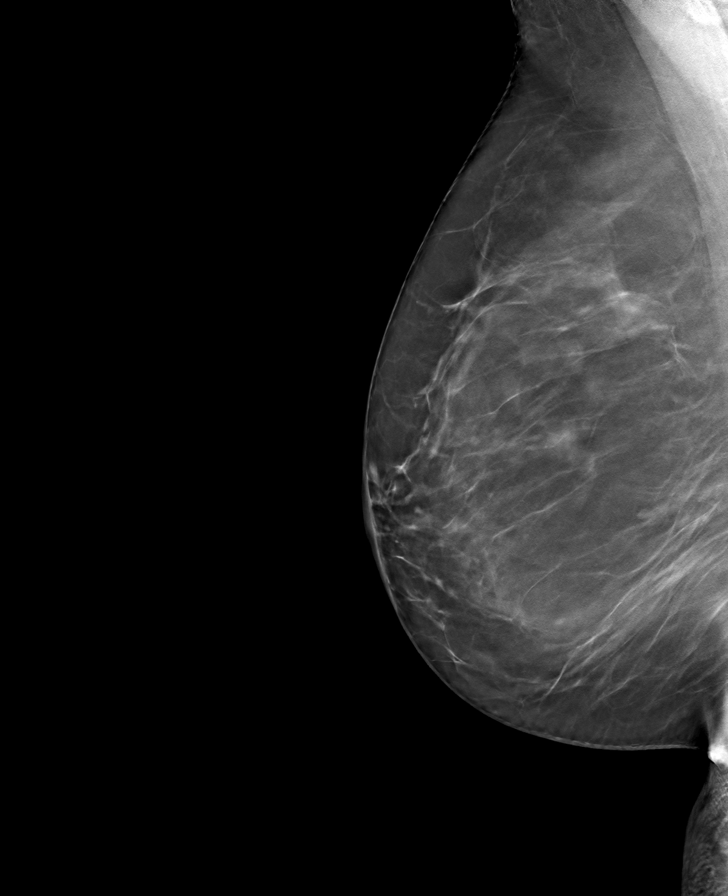

[R CC tomo · tomo slice 41/80.0]
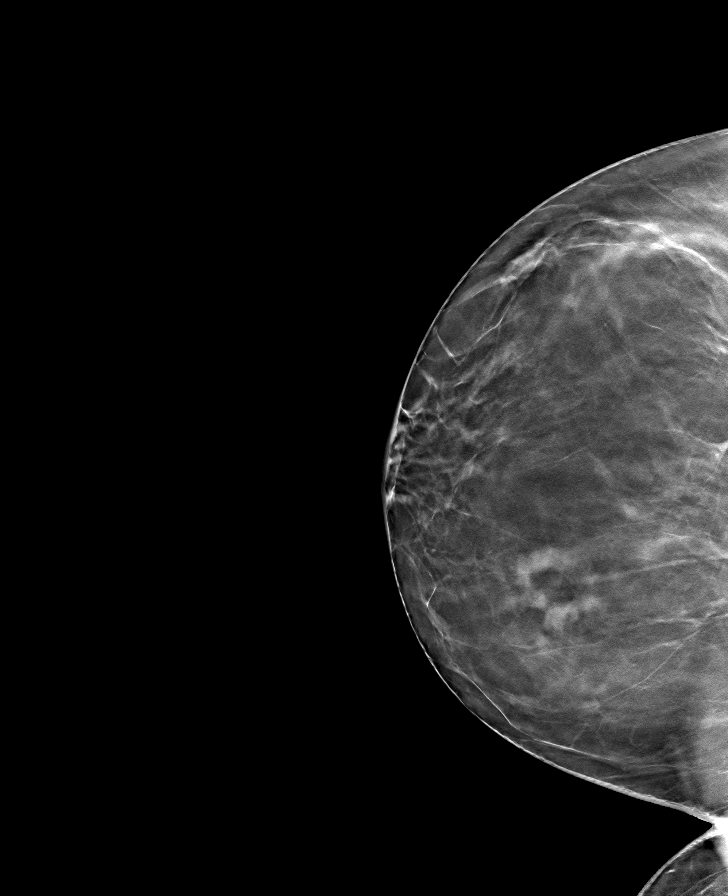

[8 of 24 positions shown; findings below may reference images not displayed]

ACR Breast Density Category b: There are scattered areas of
fibroglandular density.
FINDINGS: There are no findings suspicious for malignancy.
IMPRESSION: No mammographic evidence of malignancy. A result letter of this
screening mammogram will be mailed directly to the patient.

RECOMMENDATION:
Screening mammogram in one year. (Code:51-O-LD2)

BI-RADS CATEGORY  1: Negative.
